# Patient Record
Sex: Male | Born: 1960 | Race: White | Hispanic: No | Marital: Married | Smoking: Never smoker
Health system: Southern US, Community
[De-identification: ages and names within clinical notes are randomized; demographics above are authoritative.]

## PROBLEM LIST (undated history)

## (undated) DIAGNOSIS — R079 Chest pain, unspecified: Secondary | ICD-10-CM

## (undated) DIAGNOSIS — K220 Achalasia of cardia: Secondary | ICD-10-CM

## (undated) DIAGNOSIS — I82409 Acute embolism and thrombosis of unspecified deep veins of unspecified lower extremity: Secondary | ICD-10-CM

## (undated) DIAGNOSIS — R9431 Abnormal electrocardiogram [ECG] [EKG]: Secondary | ICD-10-CM

## (undated) DIAGNOSIS — M79609 Pain in unspecified limb: Secondary | ICD-10-CM

## (undated) DIAGNOSIS — T7840XA Allergy, unspecified, initial encounter: Secondary | ICD-10-CM

## (undated) HISTORY — DX: Allergy, unspecified, initial encounter: T78.40XA

## (undated) HISTORY — DX: Achalasia of cardia: K22.0

## (undated) HISTORY — DX: Abnormal electrocardiogram (ECG) (EKG): R94.31

## (undated) HISTORY — DX: Chest pain, unspecified: R07.9

## (undated) HISTORY — PX: CHOLECYSTECTOMY: SHX55

## (undated) HISTORY — DX: Pain in unspecified limb: M79.609

## (undated) HISTORY — DX: Acute embolism and thrombosis of unspecified deep veins of unspecified lower extremity: I82.409

## (undated) HISTORY — PX: OTHER SURGICAL HISTORY: SHX169

---

## 2000-10-26 ENCOUNTER — Encounter: Payer: Self-pay | Admitting: Internal Medicine

## 2000-10-26 ENCOUNTER — Encounter: Admission: RE | Admit: 2000-10-26 | Discharge: 2000-10-26 | Payer: Self-pay | Admitting: Internal Medicine

## 2000-11-07 ENCOUNTER — Ambulatory Visit (HOSPITAL_COMMUNITY): Admission: RE | Admit: 2000-11-07 | Discharge: 2000-11-07 | Payer: Self-pay | Admitting: *Deleted

## 2000-11-07 ENCOUNTER — Encounter: Payer: Self-pay | Admitting: *Deleted

## 2000-11-13 HISTORY — PX: HELLER MYOTOMY: SHX5259

## 2000-11-13 HISTORY — PX: GASTRIC FUNDOPLICATION: SHX226

## 2000-11-13 HISTORY — PX: ESOPHAGOGASTRIC FUNDOPLICATION: SHX405

## 2006-04-11 ENCOUNTER — Ambulatory Visit (HOSPITAL_COMMUNITY): Admission: RE | Admit: 2006-04-11 | Discharge: 2006-04-11 | Payer: Self-pay | Admitting: Family Medicine

## 2007-06-26 IMAGING — US US ABDOMEN COMPLETE
1 series · 13 of 25 positions shown · non-contrast
Comparison: None

CLINICAL DATA: Epigastric abdominal pain. Elevated lipase.

COMPLETE ABDOMINAL ULTRASOUND  04/11/2006:

[Series 1: unknown · 0.37mm/px · 13 of 53 slices shown]
[im 1/53]
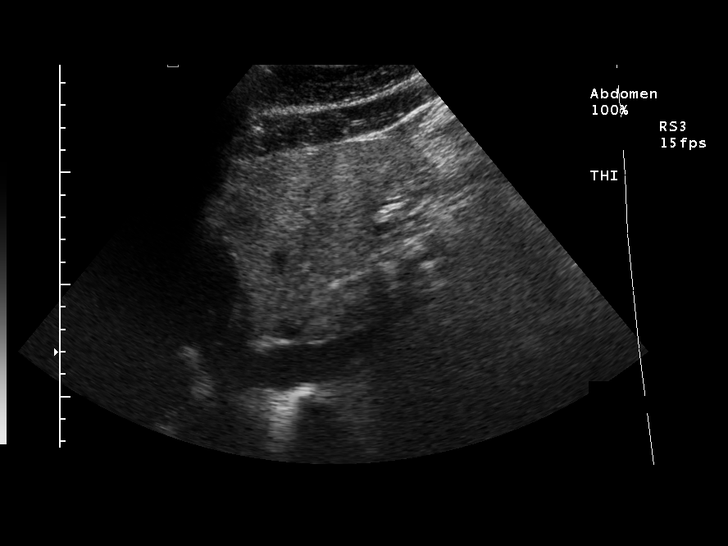
[im 5/53]
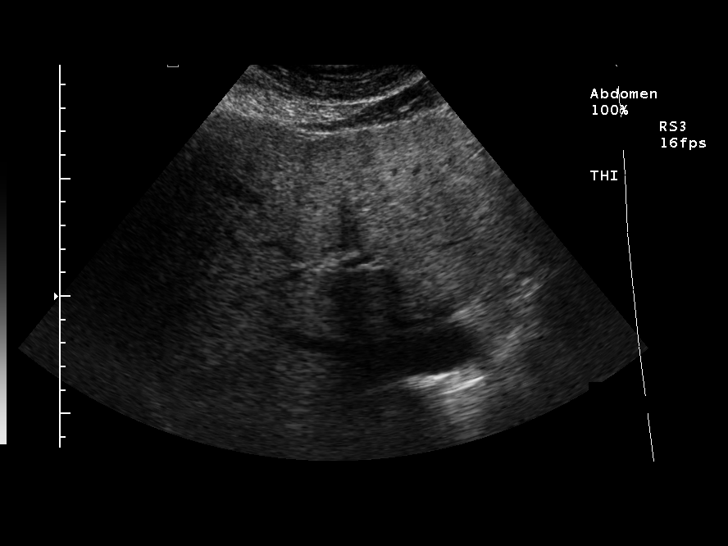
[im 9/53]
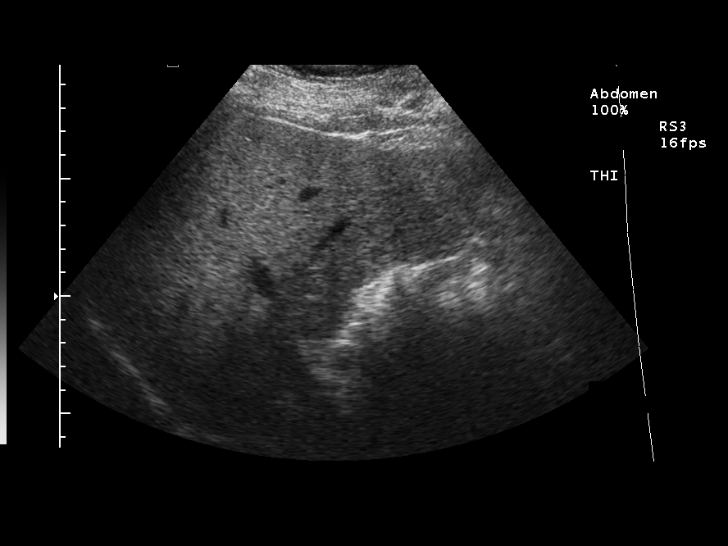
[im 14/53]
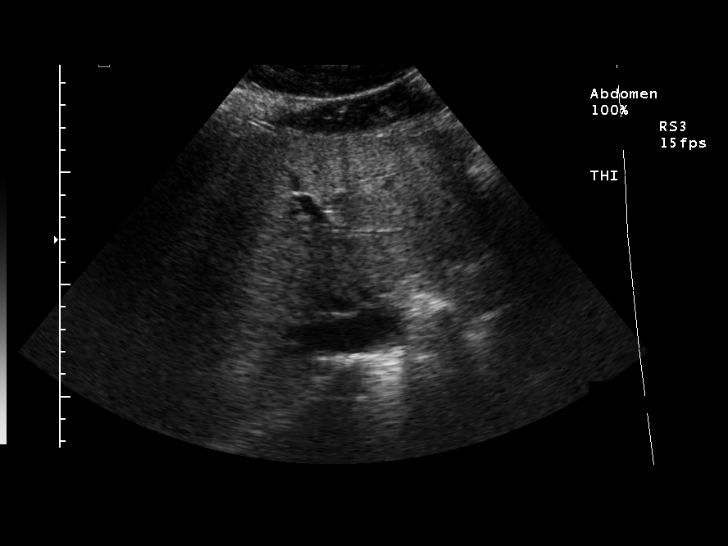
[im 18/53]
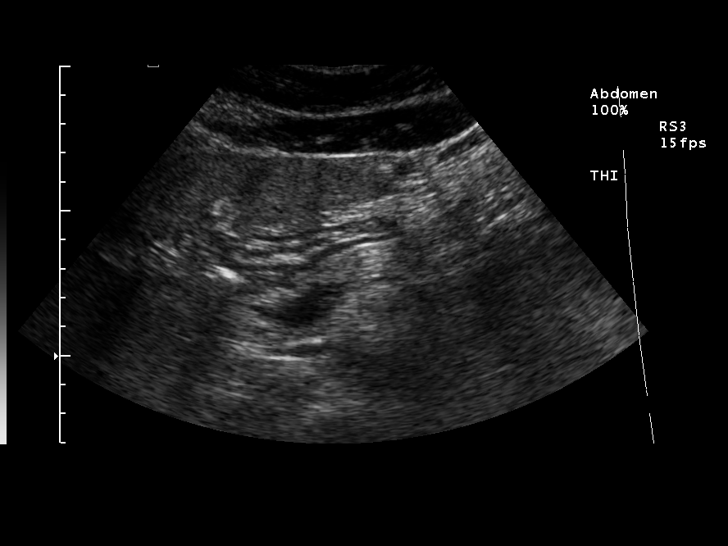
[im 22/53]
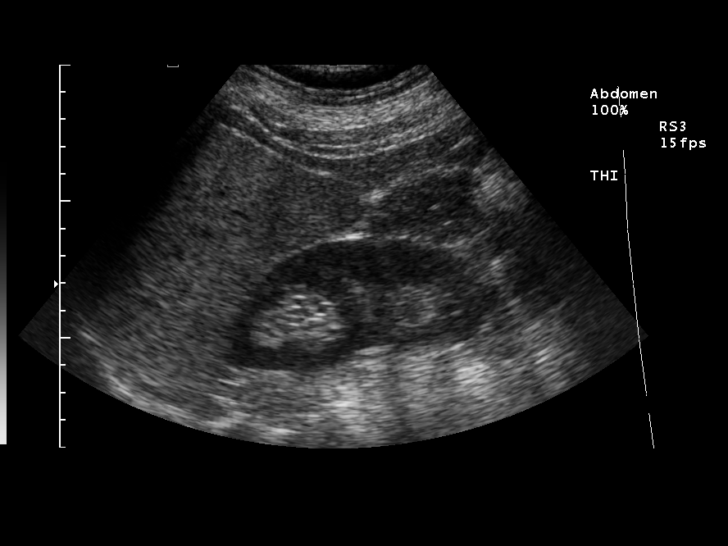
[im 27/53]
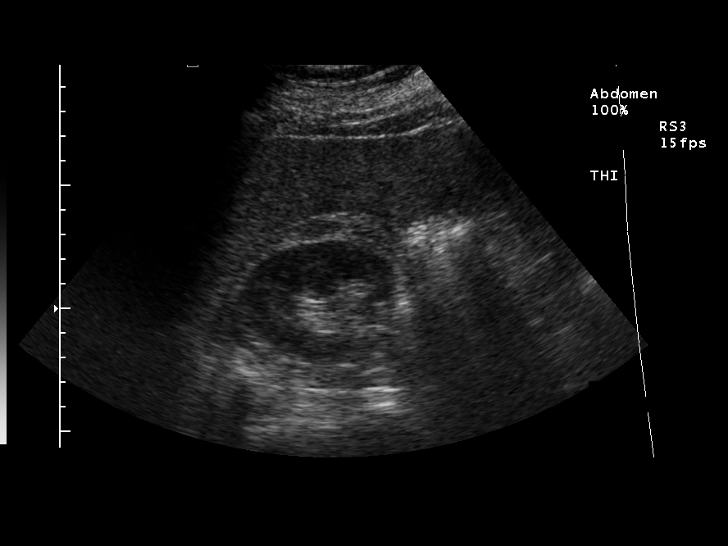
[im 31/53]
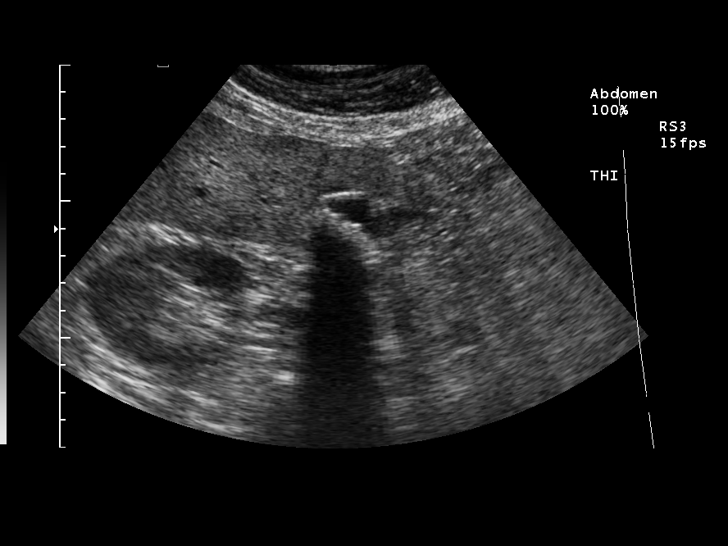
[im 35/53]
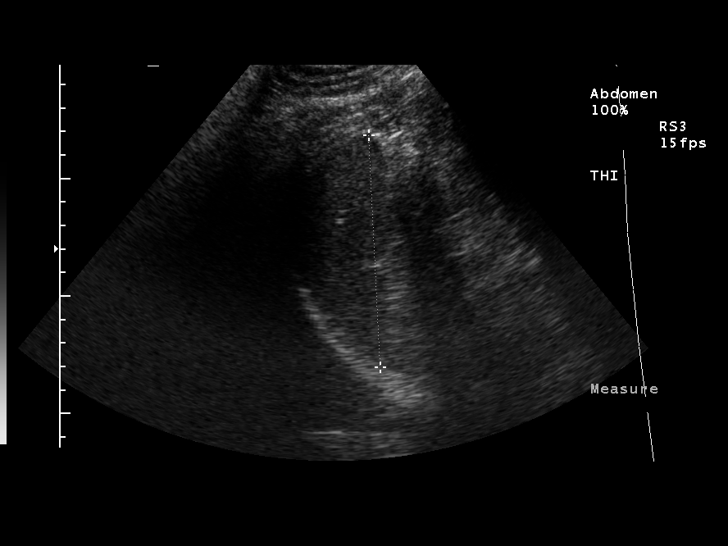
[im 40/53]
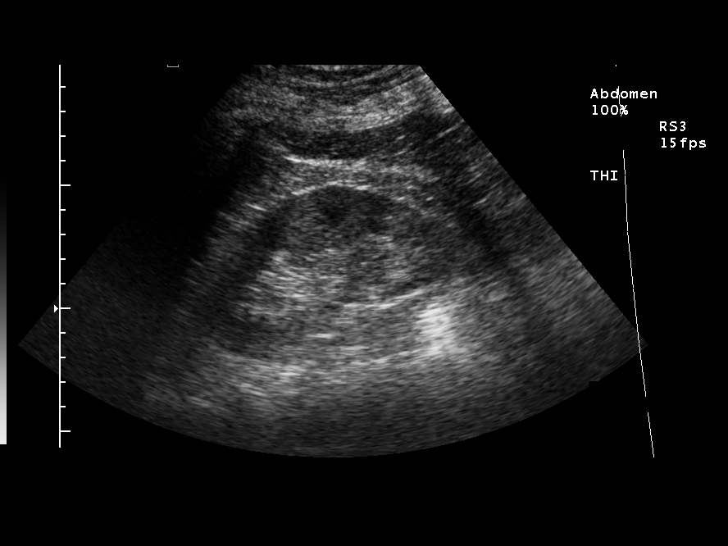
[im 44/53]
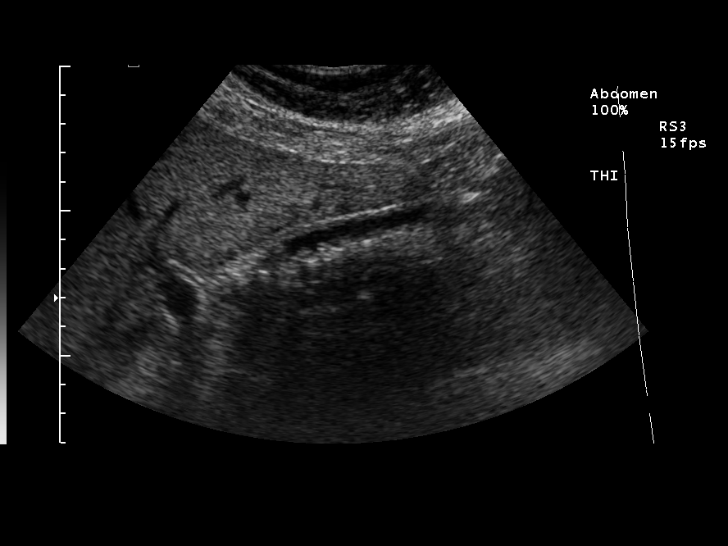
[im 48/53]
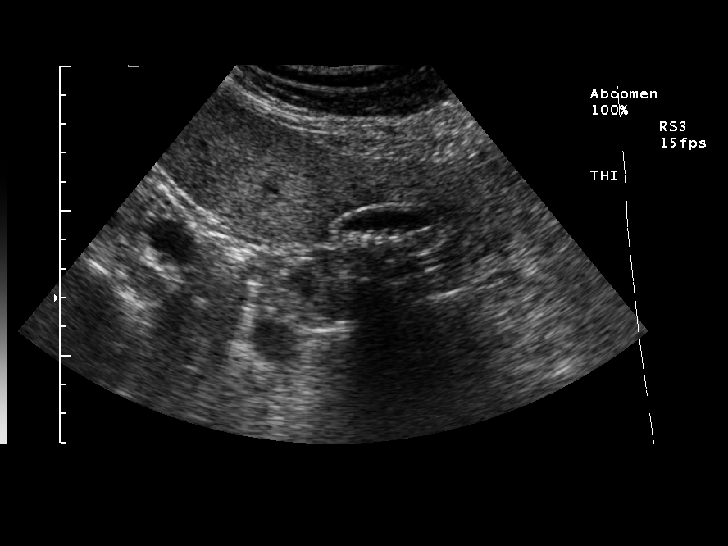
[im 53/53]
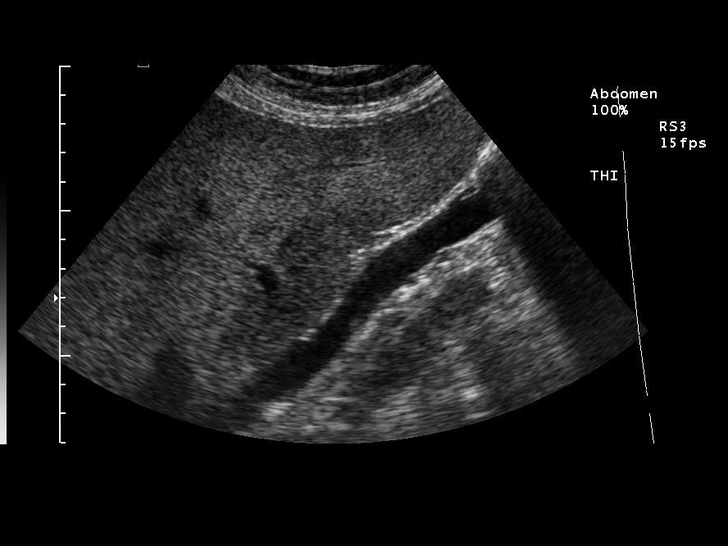

[13 of 25 positions shown; findings below may reference images not displayed]

FINDINGS: Gallbladder:  Multiple gallstones. Mild gallbladder wall thickening up to
approximately 4 mm. No pericholecystic fluid. Negative sonographic Murphy's
sign.

Common bile duct: Upper normal size to perhaps minimally dilated at 7 mm. No
shadowing common duct stones, but echogenic material is present within the duct
consistent with sludge.

Liver:  Diffusely increased and coarsened echotexture consistent with fatty
infiltration. No focal hepatic abnormalities. Patent portal vein with
hepatopetal flow.

Inferior vena cava:  Patent.

Pancreas:  Normal head and body; tail obscured by overlying bowel gas.

Spleen:  Normal, 9.9 cm in length.

Right kidney:  Normal, 11.0 cm in length. Prominent column of Bertin noted,
possibly representing duplication of the collecting system.

Left kidney:  Normal, 11.0 cm in length.

Abdominal aorta:  Normal, maximum diameter less than 2 cm.
IMPRESSION: 1. Cholelithiasis and mild gallbladder wall thickening which would be consistent
with cholecystitis.
2. Borderline enlarged common bile duct measuring 7 mm diameter. While no common
duct stones were identified, echogenic material was identified in the duct
consistent with sludge.
3. Mild diffuse fatty infiltration of the liver.
4. Pancreatic tail obscured by overlying bowel gas and therefore not evaluated.
Normal appearing pancreatic head and body.

## 2012-05-13 DIAGNOSIS — I82409 Acute embolism and thrombosis of unspecified deep veins of unspecified lower extremity: Secondary | ICD-10-CM

## 2012-05-13 HISTORY — DX: Acute embolism and thrombosis of unspecified deep veins of unspecified lower extremity: I82.409

## 2012-05-14 ENCOUNTER — Ambulatory Visit (INDEPENDENT_AMBULATORY_CARE_PROVIDER_SITE_OTHER): Payer: BC Managed Care – PPO | Admitting: Internal Medicine

## 2012-05-14 VITALS — BP 118/84 | HR 77 | Temp 98.5°F | Resp 16 | Ht 67.0 in | Wt 213.6 lb

## 2012-05-14 DIAGNOSIS — R197 Diarrhea, unspecified: Secondary | ICD-10-CM

## 2012-05-14 DIAGNOSIS — A088 Other specified intestinal infections: Secondary | ICD-10-CM

## 2012-05-14 DIAGNOSIS — Z129 Encounter for screening for malignant neoplasm, site unspecified: Secondary | ICD-10-CM

## 2012-05-14 LAB — POCT CBC
Granulocyte percent: 69.3 %G (ref 37–80)
HCT, POC: 53.1 % (ref 43.5–53.7)
Hemoglobin: 17 g/dL (ref 14.1–18.1)
Lymph, poc: 1.4 (ref 0.6–3.4)
MCH, POC: 30.4 pg (ref 27–31.2)
MCHC: 32 g/dL (ref 31.8–35.4)
MCV: 94.9 fL (ref 80–97)
MID (cbc): 0.7 (ref 0–0.9)
MPV: 9.4 fL (ref 0–99.8)
POC Granulocyte: 4.8 (ref 2–6.9)
POC LYMPH PERCENT: 20.1 %L (ref 10–50)
POC MID %: 10.6 %M (ref 0–12)
Platelet Count, POC: 232 10*3/uL (ref 142–424)
RBC: 5.6 M/uL (ref 4.69–6.13)
RDW, POC: 14.3 %
WBC: 6.9 10*3/uL (ref 4.6–10.2)

## 2012-05-14 MED ORDER — CIPROFLOXACIN HCL 500 MG PO TABS: 500.0000 mg | ORAL_TABLET | Freq: Two times a day (BID) | ORAL | Status: DC

## 2012-05-14 MED ORDER — ONDANSETRON HCL 4 MG PO TABS: ORAL_TABLET | ORAL | Status: DC

## 2012-05-14 NOTE — Progress Notes (Signed)
  Subjective:    Patient ID: [name] [name], male    DOB: Feb 28, 1961, 51 y.o.   MRN: [id number]  HPInausea, distension,anorexia,no BMs for 3 days Then watery stool after MOM Nausea comes and goes/No vomited/limited intake Several small liquid BMs last 24hr Marked fatigue No fever chills or night sweats  Review of SystemsNegative except for weight gain over the last few years Has avoided medical followups for 5 years     Objective:   Physical ExamVital signs stable Except elevated BMI Heart and lungs within normal limits Abdomen slightly distended with increased bowel sounds No organomegaly or masses Tender in the left lower quadrant to palpation but not percussion   Results for orders placed in visit on 05/14/12  POCT CBC      Component Value Range   WBC 6.9  4.6 - 10.2 K/uL   Lymph, poc 1.4  0.6 - 3.4   POC LYMPH PERCENT 20.1  10 - 50 %L   MID (cbc) 0.7  0 - 0.9   POC MID % 10.6  0 - 12 %M   POC Granulocyte 4.8  2 - 6.9   Granulocyte percent 69.3  37 - 80 %G   RBC 5.60  4.69 - 6.13 M/uL   Hemoglobin 17.0  14.1 - 18.1 g/dL   HCT, POC 40.9  81.1 - 53.7 %   MCV 94.9  80 - 97 fL   MCH, POC 30.4  27 - 31.2 pg   MCHC 32.0  31.8 - 35.4 g/dL   RDW, POC 91.4     Platelet Count, POC 232  142 - 424 K/uL   MPV 9.4  0 - 99.8 fL    Assessment & Plan:  Problem #1 gastroenteritis Culture Start Cipro 500 twice a day #6 Zofran 4-8 mg as needed Followup 48-72 hours if not controlled He will set up followup complete physical to address weight problems and probable cholesterol problems as he was discussing with Dr. Milus Glazier in 2008

## 2012-05-15 ENCOUNTER — Encounter: Payer: Self-pay | Admitting: Internal Medicine

## 2012-05-18 LAB — STOOL CULTURE

## 2012-05-20 ENCOUNTER — Ambulatory Visit (INDEPENDENT_AMBULATORY_CARE_PROVIDER_SITE_OTHER): Payer: BC Managed Care – PPO | Admitting: Family Medicine

## 2012-05-20 VITALS — BP 128/74 | HR 93 | Temp 98.3°F | Resp 16 | Ht 67.5 in | Wt 217.2 lb

## 2012-05-20 DIAGNOSIS — Z5181 Encounter for therapeutic drug level monitoring: Secondary | ICD-10-CM

## 2012-05-20 DIAGNOSIS — M549 Dorsalgia, unspecified: Secondary | ICD-10-CM

## 2012-05-20 DIAGNOSIS — M79605 Pain in left leg: Secondary | ICD-10-CM

## 2012-05-20 DIAGNOSIS — I82409 Acute embolism and thrombosis of unspecified deep veins of unspecified lower extremity: Secondary | ICD-10-CM

## 2012-05-20 DIAGNOSIS — M79609 Pain in unspecified limb: Secondary | ICD-10-CM

## 2012-05-20 LAB — POCT CBC
Hemoglobin: 14 g/dL — AB (ref 14.1–18.1)
MCH, POC: 30 pg (ref 27–31.2)
MCV: 94.8 fL (ref 80–97)
RBC: 4.67 M/uL — AB (ref 4.69–6.13)
WBC: 10.9 10*3/uL — AB (ref 4.6–10.2)

## 2012-05-20 LAB — BASIC METABOLIC PANEL
BUN: 8 mg/dL (ref 6–23)
CO2: 28 mEq/L (ref 19–32)
Chloride: 106 mEq/L (ref 96–112)
Creat: 0.98 mg/dL (ref 0.50–1.35)

## 2012-05-20 LAB — PROTIME-INR
INR: 0.96 (ref <1.50)
Prothrombin Time: 13.2 seconds (ref 11.6–15.2)

## 2012-05-20 MED ORDER — WARFARIN SODIUM 5 MG PO TABS: 5.0000 mg | ORAL_TABLET | Freq: Every day | ORAL | Status: DC

## 2012-05-20 MED ORDER — ENOXAPARIN SODIUM 150 MG/ML ~~LOC~~ SOLN: 1.5000 mg/kg | Freq: Every day | SUBCUTANEOUS | Status: DC

## 2012-05-20 MED ORDER — RIVAROXABAN 15 MG PO TABS: ORAL_TABLET | ORAL | Status: DC

## 2012-05-20 NOTE — Progress Notes (Signed)
Subjective:    Patient ID: [name] [name], male    DOB: 12-Feb-1961, 51 y.o.   MRN: [id number]  HPI  Pt presents with 6 days h/o L calf pain.  Woke up 6 days ago and thought he had a leg cramp but it has worsened.  Pain with walking.  Calf appears swollen and hot to patient.  He has never had anything like this before.  He was in a 4 hr car drive 3 days before the pain started.  No h/o clotting problems, no family h/o clotting disorders.  No trauma to leg.No knee pain or h/o injury to his knee.   Has been using tylenol over the last several days and it has only helped slightly.  He was on abx last week for presumed diverticulitis, he has finished those.  He has no SOB or CP.    Review of Systems  Respiratory: Negative for shortness of breath.   Cardiovascular: Positive for leg swelling. Negative for chest pain.  Musculoskeletal: Negative for joint swelling.       Objective:   Physical Exam  Constitutional: He is oriented to person, place, and time. He appears well-developed and well-nourished.  HENT:  Head: Normocephalic.  Right Ear: External ear normal.  Left Ear: External ear normal.  Eyes: Pupils are equal, round, and reactive to light.  Neck: Normal range of motion.  Cardiovascular: Normal rate, regular rhythm and normal heart sounds.   Pulmonary/Chest: Effort normal and breath sounds normal.  Musculoskeletal: He exhibits edema and tenderness.       Left lower leg: He exhibits tenderness (calf TTP) and swelling (Mild - L calf at 9cm measured 42.5 cm and R calf 41.5). He exhibits no edema.       Legs: Neurological: He is alert and oriented to person, place, and time.  Skin: Skin is warm and dry.  Psychiatric: He has a normal mood and affect. His behavior is normal. Thought content normal.       Assessment & Plan:   1. Leg pain, left   Pt to take OTC NSAIDs and LE doppler was ordered.  He will go to [facility] at 1pm today for doppler.  Meds will be ordered after results have  returned by call report.  Addnd per J Copland MD:  Noted to have a DVT in left distal veins on Doppler-  The clot extends to very near the popliteal fossa.  He came back in to discuss.  He describes a "cramp" in his left leg for the last 5 days or so, but he was initially distracted by GI illness.  He got better from a GI standpoint, but then the pain and swelling in his left calf continued to get worse.     He has never had a PE or DVT in the past, there is no family history of DVT/ PE.  Not a smoker.  No history of cancer, no SOB, hemoptysis, etc.    Discussed R/B/A of anticoagulation.  He is aware that there is less data concerning risk of PE from a distal DVT- however the general course of action is to treat with anticoagulation for 3 months.  Decided to start Xarelto for ease of use after discussion of alternatives (lovenox and coumadin).  Will check a stat creatinine BMP prior to start of medication.   Normal calculated creatinine clearance- he will start Xarelto as per manufactures recommendations.  He will call in about a week with an update- Sooner if worse or if  any new symptoms arise.  Estimated creatinine clearance per Cockcroft- Gault formula is 83- xarelto safe as long as greater than 30.    Results for orders placed in visit on 05/20/12  PROTIME-INR      Component Value Range   Prothrombin Time 13.2  11.6 - 15.2 seconds   INR 0.96  <1.50  POCT CBC      Component Value Range   WBC 10.9 (*) 4.6 - 10.2 K/uL   Lymph, poc 3.0  0.6 - 3.4   POC LYMPH PERCENT 27.8  10 - 50 %L   MID (cbc) 0.9  0 - 0.9   POC MID % 8.7  0 - 12 %M   POC Granulocyte 6.9  2 - 6.9   Granulocyte percent 63.5  37 - 80 %G   RBC 4.67 (*) 4.69 - 6.13 M/uL   Hemoglobin 14.0 (*) 14.1 - 18.1 g/dL   HCT, POC 40.9  81.1 - 53.7 %   MCV 94.8  80 - 97 fL   MCH, POC 30.0  27 - 31.2 pg   MCHC 31.6 (*) 31.8 - 35.4 g/dL   RDW, POC 91.4     Platelet Count, POC 317  142 - 424 K/uL   MPV 9.0  0 - 99.8 fL  BASIC  METABOLIC PANEL      Component Value Range   Sodium 142  135 - 145 mEq/L   Potassium 4.6  3.5 - 5.3 mEq/L   Chloride 106  96 - 112 mEq/L   CO2 28  19 - 32 mEq/L   Glucose, Bld 92  70 - 99 mg/dL   BUN 8  6 - 23 mg/dL   Creat 7.82  9.56 - 2.13 mg/dL   Calcium 8.9  8.4 - 08.6 mg/dL

## 2012-05-21 ENCOUNTER — Ambulatory Visit (HOSPITAL_COMMUNITY): Payer: Self-pay

## 2012-06-06 ENCOUNTER — Telehealth: Payer: Self-pay

## 2012-06-06 DIAGNOSIS — O2230 Deep phlebothrombosis in pregnancy, unspecified trimester: Secondary | ICD-10-CM

## 2012-06-06 DIAGNOSIS — I82409 Acute embolism and thrombosis of unspecified deep veins of unspecified lower extremity: Secondary | ICD-10-CM

## 2012-06-06 NOTE — Telephone Encounter (Signed)
Dr. Patsy Lager had asked Sasha to call her back with an update so he is calling to check in with her. He was here for blood clots earlier in the month. He also wanted to know if he will need his prescription refilled. He uses [facility], [facility] on [address].  Best [phone]

## 2012-06-07 NOTE — Telephone Encounter (Signed)
Pt is rechecking with Dr. Patsy Lager please call  [phone]

## 2012-06-09 NOTE — Telephone Encounter (Signed)
LMOM TO CB 

## 2012-06-09 NOTE — Telephone Encounter (Signed)
PT STATES THAT HE ABLE TO WALK NOW.  HE HAS A TINY BIT OF SWELLING.  HE TRIED WALKING FOR 15 MINUTES BUT AFTERWARDS HE FELT PAIN.  HE WAS WONDERING ABOUT THE REFILL OF MEDS AND WHEN WE CALL HIM BACK HE WILL LET us KNOW WHAT PHARMACY TO CALL.

## 2012-06-10 ENCOUNTER — Telehealth: Payer: Self-pay

## 2012-06-10 MED ORDER — RIVAROXABAN 20 MG PO TABS: 20.0000 mg | ORAL_TABLET | Freq: Every day | ORAL | Status: DC

## 2012-06-10 NOTE — Telephone Encounter (Signed)
Pt calling to talk with dr copland about his medication he is will be out of it tomorrow [phone]

## 2012-06-10 NOTE — Telephone Encounter (Signed)
He stated he has already spoken to Dr Patsy Lager and asked me to call his wife about her previous message, I have done this for him.

## 2012-06-10 NOTE — Telephone Encounter (Signed)
Called to discuss.  His leg is feeling better.  He is now finished with his 3 weeks of BID xarelto 15mg .  Will start on QD 20mg  now to complete a total of 3 months of therapy.  I will arrange an ultrasound for 6 weeks from now to evaluate his clot.

## 2012-06-10 NOTE — Telephone Encounter (Signed)
i

## 2012-06-14 ENCOUNTER — Telehealth: Payer: Self-pay

## 2012-06-14 NOTE — Telephone Encounter (Signed)
Pt would like a copy of his lab results that were recently done early part of July, please mail to his addresss.

## 2012-06-14 NOTE — Telephone Encounter (Signed)
mailed

## 2012-06-19 ENCOUNTER — Encounter: Payer: Self-pay | Admitting: Internal Medicine

## 2012-09-16 ENCOUNTER — Telehealth: Payer: Self-pay

## 2012-09-16 DIAGNOSIS — I82409 Acute embolism and thrombosis of unspecified deep veins of unspecified lower extremity: Secondary | ICD-10-CM

## 2012-09-16 NOTE — Telephone Encounter (Signed)
Called and talked with him- he finished the xarelto just about 3 weeks ago.  He has felt well and actually forgot all about it.  We have scheduled a doppler for him which should be done in the next couple of days.  He should be ok to get a colonoscopy but cautioned him to let his GI doctor know about when he finished the blood thinner

## 2012-09-16 NOTE — Telephone Encounter (Signed)
PT HAD BLOOD CLOTS IN HIS LEGS AND DR COPLAND WANTED TO MAKE SURE THEY WERE GONE BEFORE SHE SCHEDULED HIM TO HAVE A COLONOSPY DONE. ALSO HE WAS TO HAVE ANOTHER REFERRAL DONE AND WASN'T WHAT IT WAS. PLEASE CALL [phone]

## 2012-09-16 NOTE — Telephone Encounter (Signed)
I spoke to patient, he took Xarelto for his DVT., he wants to know if he needs follow up doppler for this, according to your note he was to have this done after his medications were completed. I have put in order to have this done. Also he is asking if it is okay for him to proceed now with the Colonoscopy, he had to cancel in July due to DVT, and treatment of this.

## 2012-09-22 NOTE — Telephone Encounter (Signed)
Called and LMOM-has his [place] been scheduled?  If not please let me know.  Also, wanted to talk with him about hypercoagulability testing, continued anticoagulation, etc.  Please give me a call when possible

## 2012-09-23 ENCOUNTER — Encounter: Payer: Self-pay | Admitting: Internal Medicine

## 2012-09-23 ENCOUNTER — Telehealth: Payer: Self-pay | Admitting: *Deleted

## 2012-09-23 NOTE — Telephone Encounter (Signed)
As per Dr. Cyndie Chime request called patient asked him about a follow up ultrasound for a DVT  Patient said it was scheduled for 09/25/12 at 1:00 pm.  Told patient he could also go for a follow-up colonoscopy.  Found out previous one done at [facility] gave patient office phone number.  Patient will schedule own appointment.  Angie Kiante Petrovich, CMA.

## 2012-09-25 ENCOUNTER — Telehealth: Payer: Self-pay | Admitting: Family Medicine

## 2012-09-25 ENCOUNTER — Ambulatory Visit (HOSPITAL_COMMUNITY)
Admission: RE | Admit: 2012-09-25 | Discharge: 2012-09-25 | Disposition: A | Discharge status: Home / Self Care | Payer: BC Managed Care – PPO | Source: Ambulatory Visit | Attending: Family Medicine | Admitting: Family Medicine

## 2012-09-25 DIAGNOSIS — I82409 Acute embolism and thrombosis of unspecified deep veins of unspecified lower extremity: Secondary | ICD-10-CM

## 2012-09-25 DIAGNOSIS — Z86718 Personal history of other venous thrombosis and embolism: Secondary | ICD-10-CM | POA: Insufficient documentation

## 2012-09-25 NOTE — Progress Notes (Signed)
Left:  No evidence of DVT, superficial thrombosis, or Baker's cyst.  Right:  Negative for DVT in the common femoral vein.  

## 2012-09-25 NOTE — Telephone Encounter (Signed)
Doppler received from [facility] and in your box.

## 2012-09-25 NOTE — Telephone Encounter (Signed)
He had his repeat ultrasound done today- it looked fine and clot is resolved.  Called to talk with him- he had a single episode of a distal DVT which we treated with xarelto successfully.    Discussed hypercoagulability testing. His paternal grandfather did have a DVT in the 1940s he thinks, but he is not sure of details.  No other family history of clots.  He declined to have hypercoagulability testing done at this time.  This is reasonable as it would not likely change our current management. Encouraged him to take a baby asa daily, and to move around frequently on long car or plane trips.   He is going to have pre- op labs for a colonoscopy done on 10/15/12, asked him to have a copy sent to me so I can recheck his CBC.   He will look out of any signs of recurrent DVT

## 2012-09-25 NOTE — Telephone Encounter (Signed)
Can we please get a copy of his LE doppler done in July of this year- I think at [facility]?  Thanks!

## 2012-09-25 NOTE — Telephone Encounter (Signed)
Called [facility] and they will send doppler report.

## 2012-10-14 ENCOUNTER — Ambulatory Visit (AMBULATORY_SURGERY_CENTER): Payer: BC Managed Care – PPO | Admitting: *Deleted

## 2012-10-14 VITALS — Ht 68.0 in | Wt 216.0 lb

## 2012-10-14 DIAGNOSIS — Z1211 Encounter for screening for malignant neoplasm of colon: Secondary | ICD-10-CM

## 2012-10-14 MED ORDER — NA SULFATE-K SULFATE-MG SULF 17.5-3.13-1.6 GM/177ML PO SOLN: ORAL | Status: DC

## 2012-10-25 ENCOUNTER — Ambulatory Visit (AMBULATORY_SURGERY_CENTER): Payer: BC Managed Care – PPO | Admitting: Internal Medicine

## 2012-10-25 ENCOUNTER — Encounter: Payer: Self-pay | Admitting: Internal Medicine

## 2012-10-25 VITALS — BP 117/66 | HR 68 | Temp 98.4°F | Resp 16 | Ht 68.0 in | Wt 216.0 lb

## 2012-10-25 DIAGNOSIS — Z1211 Encounter for screening for malignant neoplasm of colon: Secondary | ICD-10-CM

## 2012-10-25 MED ORDER — SODIUM CHLORIDE 0.9 % IV SOLN: 500.0000 mL | INTRAVENOUS | Status: DC

## 2012-10-25 NOTE — Patient Instructions (Addendum)
The colonoscopy showed diverticulosis but no polyps or cancer.  Next routine colonoscopy in 10 years - 2023.  Thank you for choosing me and [facility].  Iva Boop, MD, FACG    YOU HAD AN ENDOSCOPIC PROCEDURE TODAY AT THE [facility]: Refer to the procedure report that was given to you for any specific questions about what was found during the examination.  If the procedure report does not answer your questions, please call your gastroenterologist to clarify.  If you requested that your care partner not be given the details of your procedure findings, then the procedure report has been included in a sealed envelope for you to review at your convenience later.  YOU SHOULD EXPECT: Some feelings of bloating in the abdomen. Passage of more gas than usual.  Walking can help get rid of the air that was put into your GI tract during the procedure and reduce the bloating. If you had a lower endoscopy (such as a colonoscopy or flexible sigmoidoscopy) you may notice spotting of blood in your stool or on the toilet paper. If you underwent a bowel prep for your procedure, then you may not have a normal bowel movement for a few days.  DIET: Your first meal following the procedure should be a light meal and then it is ok to progress to your normal diet.  A half-sandwich or bowl of soup is an example of a good first meal.  Heavy or fried foods are harder to digest and may make you feel nauseous or bloated.  Likewise meals heavy in dairy and vegetables can cause extra gas to form and this can also increase the bloating.  Drink plenty of fluids but you should avoid alcoholic beverages for 24 hours.  ACTIVITY: Your care partner should take you home directly after the procedure.  You should plan to take it easy, moving slowly for the rest of the day.  You can resume normal activity the day after the procedure however you should NOT DRIVE or use heavy machinery for 24 hours (because of  the sedation medicines used during the test).    SYMPTOMS TO REPORT IMMEDIATELY: A gastroenterologist can be reached at any hour.  During normal business hours, 8:30 AM to 5:00 PM Monday through Friday, call [phone].  After hours and on weekends, please call the GI answering service at [phone] who will take a message and have the physician on call contact you.   Following lower endoscopy (colonoscopy or flexible sigmoidoscopy):  Excessive amounts of blood in the stool  Significant tenderness or worsening of abdominal pains  Swelling of the abdomen that is new, acute  Fever of 100F or higher  FOLLOW UP: If any biopsies were taken you will be contacted by phone or by letter within the next 1-3 weeks.  Call your gastroenterologist if you have not heard about the biopsies in 3 weeks.  Our staff will call the home number listed on your records the next business day following your procedure to check on you and address any questions or concerns that you may have at that time regarding the information given to you following your procedure. This is a courtesy call and so if there is no answer at the home number and we have not heard from you through the emergency physician on call, we will assume that you have returned to your regular daily activities without incident.  SIGNATURES/CONFIDENTIALITY: You and/or your care partner have signed paperwork which will be entered  into your electronic medical record.  These signatures attest to the fact that that the information above on your After Visit Summary has been reviewed and is understood.  Full responsibility of the confidentiality of this discharge information lies with you and/or your care-partner.    Diverticulosis and high fiber diet information given.    Next colonoscopy 10 years 2023.

## 2012-10-25 NOTE — Op Note (Signed)
[facility] [address] [place], [address]   COLONOSCOPY PROCEDURE REPORT  PATIENT: [name] [name]  MR#: [id number] BIRTHDATE: 04/22/1961 , 51  yrs. old GENDER: Male ENDOSCOPIST: Iva Boop, MD, [facility] REFERRED XL:[id number] Copland, M.D. PROCEDURE DATE:  10/25/2012 PROCEDURE:   Colonoscopy, diagnostic ASA CLASS:   Class I INDICATIONS:average risk screening. MEDICATIONS: Propofol (Diprivan) 140 mg IV, MAC sedation, administered by CRNA, and These medications were titrated to patient response per physician's verbal order  DESCRIPTION OF PROCEDURE:   After the risks benefits and alternatives of the procedure were thoroughly explained, informed consent was obtained.  A digital rectal exam revealed no abnormalities of the rectum and A digital rectal exam revealed the prostate was not enlarged.   The LB [id number]  endoscope was introduced through the anus and advanced to the cecum, which was identified by both the appendix and ileocecal valve. No adverse events experienced.   The quality of the prep was Suprep excellent The instrument was then slowly withdrawn as the colon was fully examined.      COLON FINDINGS: Mild diverticulosis was noted in the sigmoid colon. The colon mucosa was otherwise normal.  Retroflexed views revealed no abnormalities. The time to cecum=1 minutes 53 seconds. Withdrawal time=7 minutes 10 seconds.  The scope was withdrawn and the procedure completed. COMPLICATIONS: There were no complications.  ENDOSCOPIC IMPRESSION: 1.   Mild diverticulosis was noted in the sigmoid colon 2.   The colon mucosa was otherwise normal - excellent prep  RECOMMENDATIONS: Repeat Colonscopy in 10 years.   eSigned:  Iva Boop, MD, [facility] 10/25/2012 3:41 PM   cc: Abbe Amsterdam, MD and The Patient

## 2012-10-25 NOTE — Progress Notes (Signed)
Patient did not experience any of the following events: a burn prior to discharge; a fall within the facility; wrong site/side/patient/procedure/implant event; or a hospital transfer or hospital admission upon discharge from the facility. (G8907) Patient did not have preoperative order for IV antibiotic SSI prophylaxis. (G8918)  

## 2012-10-28 ENCOUNTER — Telehealth: Payer: Self-pay | Admitting: *Deleted

## 2012-10-28 NOTE — Telephone Encounter (Signed)
  Follow up Call-  Call back number 10/25/2012  Post procedure Call Back phone  # [phone] cell  Permission to leave phone message Yes     Patient questions:  Do you have a fever, pain , or abdominal swelling? no Pain Score  0 *  Have you tolerated food without any problems? yes  Have you been able to return to your normal activities? yes  Do you have any questions about your discharge instructions: Diet   no Medications  no Follow up visit  no  Do you have questions or concerns about your Care? no  Actions: * If pain score is 4 or above: No action needed, pain <4.

## 2013-08-22 ENCOUNTER — Telehealth: Payer: Self-pay

## 2013-08-22 ENCOUNTER — Ambulatory Visit (INDEPENDENT_AMBULATORY_CARE_PROVIDER_SITE_OTHER): Payer: BC Managed Care – PPO | Admitting: Internal Medicine

## 2013-08-22 VITALS — BP 112/68 | HR 60 | Temp 98.7°F | Resp 16 | Ht 67.0 in | Wt 219.0 lb

## 2013-08-22 DIAGNOSIS — Z5189 Encounter for other specified aftercare: Secondary | ICD-10-CM

## 2013-08-22 DIAGNOSIS — T782XXD Anaphylactic shock, unspecified, subsequent encounter: Secondary | ICD-10-CM

## 2013-08-22 DIAGNOSIS — H6092 Unspecified otitis externa, left ear: Secondary | ICD-10-CM

## 2013-08-22 DIAGNOSIS — H9202 Otalgia, left ear: Secondary | ICD-10-CM

## 2013-08-22 DIAGNOSIS — T782XXA Anaphylactic shock, unspecified, initial encounter: Secondary | ICD-10-CM | POA: Insufficient documentation

## 2013-08-22 DIAGNOSIS — H9209 Otalgia, unspecified ear: Secondary | ICD-10-CM

## 2013-08-22 DIAGNOSIS — Z91030 Bee allergy status: Secondary | ICD-10-CM

## 2013-08-22 DIAGNOSIS — H60399 Other infective otitis externa, unspecified ear: Secondary | ICD-10-CM

## 2013-08-22 DIAGNOSIS — Z91038 Other insect allergy status: Secondary | ICD-10-CM

## 2013-08-22 MED ORDER — EPINEPHRINE 0.3 MG/0.3ML IJ SOAJ: 0.3000 mg | Freq: Once | INTRAMUSCULAR | Status: DC

## 2013-08-22 MED ORDER — NEOMYCIN-POLYMYXIN-HC 3.5-10000-1 OT SOLN: 4.0000 [drp] | Freq: Four times a day (QID) | OTIC | Status: DC

## 2013-08-22 MED ORDER — CIPROFLOXACIN-HYDROCORTISONE 0.2-1 % OT SUSP: 3.0000 [drp] | Freq: Two times a day (BID) | OTIC | Status: DC

## 2013-08-22 NOTE — Telephone Encounter (Signed)
Please advise is there anything more cost effective? cipro otic was sent.

## 2013-08-22 NOTE — Telephone Encounter (Signed)
Please notify patient I've sent:  Meds ordered this encounter  Medications  . neomycin-polymyxin-hydrocortisone (CORTISPORIN) otic solution    Sig: Place 4 drops into the left ear 4 (four) times daily.    Dispense:  10 mL    Refill:  0    Order Specific Question:  Supervising Provider    Answer:  DOOLITTLE, ROBERT P [[id number]]

## 2013-08-22 NOTE — Patient Instructions (Signed)
Otitis Externa Otitis externa is a bacterial or fungal infection of the outer ear canal. This is the area from the eardrum to the outside of the ear. Otitis externa is sometimes called "swimmer's ear." CAUSES  Possible causes of infection include:  Swimming in dirty water.  Moisture remaining in the ear after swimming or bathing.  Mild injury (trauma) to the ear.  Objects stuck in the ear (foreign body).  Cuts or scrapes (abrasions) on the outside of the ear. SYMPTOMS  The first symptom of infection is often itching in the ear canal. Later signs and symptoms may include swelling and redness of the ear canal, ear pain, and yellowish-white fluid (pus) coming from the ear. The ear pain may be worse when pulling on the earlobe. DIAGNOSIS  Your caregiver will perform a physical exam. A sample of fluid may be taken from the ear and examined for bacteria or fungi. TREATMENT  Antibiotic ear drops are often given for 10 to 14 days. Treatment may also include pain medicine or corticosteroids to reduce itching and swelling. PREVENTION   Keep your ear dry. Use the corner of a towel to absorb water out of the ear canal after swimming or bathing.  Avoid scratching or putting objects inside your ear. This can damage the ear canal or remove the protective wax that lines the canal. This makes it easier for bacteria and fungi to grow.  Avoid swimming in lakes, polluted water, or poorly chlorinated pools.  You may use ear drops made of rubbing alcohol and vinegar after swimming. Combine equal parts of white vinegar and alcohol in a bottle. Put 3 or 4 drops into each ear after swimming. HOME CARE INSTRUCTIONS   Apply antibiotic ear drops to the ear canal as prescribed by your caregiver.  Only take over-the-counter or prescription medicines for pain, discomfort, or fever as directed by your caregiver.  If you have diabetes, follow any additional treatment instructions from your caregiver.  Keep all  follow-up appointments as directed by your caregiver. SEEK MEDICAL CARE IF:   You have a fever.  Your ear is still red, swollen, painful, or draining pus after 3 days.  Your redness, swelling, or pain gets worse.  You have a severe headache.  You have redness, swelling, pain, or tenderness in the area behind your ear. MAKE SURE YOU:   Understand these instructions.  Will watch your condition.  Will get help right away if you are not doing well or get worse. Document Released: 10/30/2005 Document Revised: 01/22/2012 Document Reviewed: 11/16/2011 Exit[employer] Patient Information 2014 [employer].  

## 2013-08-22 NOTE — Progress Notes (Signed)
  Subjective:    Patient ID: [name] [name], male    DOB: 03-20-1961, 52 y.o.   MRN: [id number]  HPI    Review of Systems     Objective:   Physical Exam        Assessment & Plan:

## 2013-08-22 NOTE — Telephone Encounter (Signed)
Pt is wanting to know if there is another prescription of the ear drops he can get because they are like 200$ at the pharmacy Call back number is [phone]

## 2013-08-22 NOTE — Progress Notes (Signed)
  Subjective:    Patient ID: [name] [name], male    DOB: May 13, 1961, 52 y.o.   MRN: [id number]  HPI Presents today for left ear itching. Started last week, this morning his ear felt full. Feels itching both inside and outside Now starting to feel painful and swollen today. No fever. No diabetes.   Review of Systems     Objective:   Physical Exam  Constitutional: He is oriented to person, place, and time. He appears well-developed and well-nourished. No distress.  HENT:  Head: Normocephalic.  Right Ear: Hearing, tympanic membrane, external ear and ear canal normal.  Left Ear: Hearing and tympanic membrane normal. No lacerations. There is swelling and tenderness. No drainage. No foreign bodies. No mastoid tenderness. Tympanic membrane is not injected, not scarred, not perforated, not erythematous, not retracted and not bulging. Tympanic membrane mobility is normal.  No middle ear effusion. No hemotympanum. No decreased hearing is noted.  Nose: Nose normal.  Mouth/Throat: Oropharynx is clear and moist.  Left ear canal red and mildly swollen.  Eyes: EOM are normal.  Neck: Neck supple.  Pulmonary/Chest: Effort normal.  Lymphadenopathy:    He has no cervical adenopathy.  Neurological: He is alert and oriented to person, place, and time. No cranial nerve deficit. He exhibits normal muscle tone. Coordination normal.  Psychiatric: He has a normal mood and affect.          Assessment & Plan:  External otitis left Hx of anaphylaxis Cipro otic/RF epipen

## 2013-08-22 NOTE — Telephone Encounter (Signed)
Pt went to pharmacy ear drops are like 200$ he wants to know if he can get another prescription Call back number is [phone]

## 2013-08-24 NOTE — Telephone Encounter (Signed)
Pt notified that rx is ready for pick up

## 2015-05-10 ENCOUNTER — Ambulatory Visit (INDEPENDENT_AMBULATORY_CARE_PROVIDER_SITE_OTHER): Payer: BLUE CROSS/BLUE SHIELD | Admitting: Emergency Medicine

## 2015-05-10 VITALS — BP 106/74 | HR 80 | Temp 99.1°F | Resp 16 | Ht 67.0 in | Wt 208.0 lb

## 2015-05-10 DIAGNOSIS — K5732 Diverticulitis of large intestine without perforation or abscess without bleeding: Secondary | ICD-10-CM

## 2015-05-10 MED ORDER — METRONIDAZOLE 500 MG PO TABS: 500.0000 mg | ORAL_TABLET | Freq: Four times a day (QID) | ORAL | Status: DC

## 2015-05-10 MED ORDER — CIPROFLOXACIN HCL 500 MG PO TABS: 500.0000 mg | ORAL_TABLET | Freq: Two times a day (BID) | ORAL | Status: DC

## 2015-05-10 NOTE — Progress Notes (Signed)
Subjective:  Patient ID: [name] [name], male    DOB: November 11, 1961  Age: 54 y.o. MRN: [id number]  CC: Abdominal Pain   HPI [name] [name] presents  with left lower quadrant abdominal pain over the last 2 days. Said the pain is progressively worsened. He has a history of diverticulosis on colonoscopy. No fever chills no nausea vomiting no blood in stool. No change in stool pattern. Says the pain is worse when he [place] the railroad tracks. Said no improvement with over-the-counter medication. His he is concerned by the pain as he starting a new job tomorrow in [place] and doesn't want to have this interfere with his new job.  History [name] [name] has a past medical history of DVT (deep venous thrombosis) (05/2012) and Achalasia.   He has past surgical history that includes Gastric fundoplication (2002); Heller myotomy (2002); and gallbladder .   His  family history is negative for Colon cancer, Stomach cancer, Esophageal cancer, and Rectal cancer.  He   reports that he has never smoked. He has never used smokeless tobacco. He reports that he does not drink alcohol or use illicit drugs.  Outpatient Prescriptions Prior to Visit  Medication Sig Dispense Refill  . acetaminophen (TYLENOL) 325 MG tablet Take 250 mg by mouth every 6 (six) hours as needed.    Marland Kitchen EPINEPHrine (EPIPEN 2-PAK) 0.3 mg/0.3 mL SOAJ injection Inject 0.3 mLs (0.3 mg total) into the muscle once. (Patient not taking: Reported on 05/10/2015) 2 Device 1  . neomycin-polymyxin-hydrocortisone (CORTISPORIN) otic solution Place 4 drops into the left ear 4 (four) times daily. (Patient not taking: Reported on 05/10/2015) 10 mL 0   No facility-administered medications prior to visit.    History   Social History  . Marital Status: Married    Spouse Name: N/A  . Number of Children: N/A  . Years of Education: N/A   Social History Main Topics  . Smoking status: Never Smoker   . Smokeless tobacco: Never Used  . Alcohol Use: No  .  Drug Use: No  . Sexual Activity: Not on file   Other Topics Concern  . None   Social History Narrative     Review of Systems  Constitutional: Negative for fever, chills and appetite change.  HENT: Negative for congestion, ear pain, postnasal drip, sinus pressure and sore throat.   Eyes: Negative for pain and redness.  Respiratory: Negative for cough, shortness of breath and wheezing.   Cardiovascular: Negative for leg swelling.  Gastrointestinal: Positive for abdominal pain. Negative for nausea, vomiting, diarrhea, constipation and blood in stool.  Endocrine: Negative for polyuria.  Genitourinary: Negative for dysuria, urgency, frequency and flank pain.  Musculoskeletal: Negative for gait problem.  Skin: Negative for rash.  Neurological: Negative for weakness and headaches.  Psychiatric/Behavioral: Negative for confusion and decreased concentration. The patient is not nervous/anxious.     Objective:  BP 106/74 mmHg  Pulse 80  Temp(Src) 99.1 F (37.3 C) (Oral)  Resp 16  Ht 5\' 7"  (1.702 m)  Wt 208 lb (94.348 kg)  BMI 32.57 kg/m2  SpO2 98%  Physical Exam  Constitutional: He is oriented to person, place, and time. He appears well-developed and well-nourished. No distress.  HENT:  Head: Normocephalic and atraumatic.  Right Ear: External ear normal.  Left Ear: External ear normal.  Nose: Nose normal.  Eyes: Conjunctivae and EOM are normal. Pupils are equal, round, and reactive to light. No scleral icterus.  Neck: Normal range of motion. Neck supple. No tracheal  deviation present.  Cardiovascular: Normal rate, regular rhythm and normal heart sounds.   Pulmonary/Chest: Effort normal. No respiratory distress. He has no wheezes. He has no rales.  Abdominal: He exhibits no mass. There is tenderness in the left lower quadrant. There is rebound and guarding.  Musculoskeletal: He exhibits no edema.  Lymphadenopathy:    He has no cervical adenopathy.  Neurological: He is alert and  oriented to person, place, and time. Coordination normal.  Skin: Skin is warm and dry. No rash noted.  Psychiatric: He has a normal mood and affect. His behavior is normal.      Assessment & Plan:   [name] [name] was seen today for abdominal pain.  Diagnoses and all orders for this visit:  Diverticulitis of colon  Other orders -     ciprofloxacin (CIPRO) 500 MG tablet; Take 1 tablet (500 mg total) by mouth 2 (two) times daily. -     metroNIDAZOLE (FLAGYL) 500 MG tablet; Take 1 tablet (500 mg total) by mouth 4 (four) times daily.   I am having [name] [name] start on ciprofloxacin and metroNIDAZOLE. I am also having him maintain his acetaminophen, EPINEPHrine, and neomycin-polymyxin-hydrocortisone.  Meds ordered this encounter  Medications  . ciprofloxacin (CIPRO) 500 MG tablet    Sig: Take 1 tablet (500 mg total) by mouth 2 (two) times daily.    Dispense:  20 tablet    Refill:  0  . metroNIDAZOLE (FLAGYL) 500 MG tablet    Sig: Take 1 tablet (500 mg total) by mouth 4 (four) times daily.    Dispense:  40 tablet    Refill:  0    Appropriate red flag conditions were discussed with the patient as well as actions that should be taken.  Patient expressed his understanding.  Given his colonoscopy was significant for diverticulosis I don't think a CAT scan at this point is warranted if his condition worsens i.e. fever chills nausea vomiting or increased pain then we need to see him back or have him go to the emergency room and I explained to him. He understands will comply.  Follow-up: Return if symptoms worsen or fail to improve.  Carmelina Dane, MD

## 2015-05-10 NOTE — Patient Instructions (Signed)

## 2015-05-25 ENCOUNTER — Encounter: Payer: Self-pay | Admitting: *Deleted

## 2015-05-31 ENCOUNTER — Encounter: Payer: Self-pay | Admitting: Cardiovascular Disease

## 2015-07-09 ENCOUNTER — Encounter (HOSPITAL_COMMUNITY): Payer: Self-pay | Admitting: Emergency Medicine

## 2015-07-09 ENCOUNTER — Emergency Department (INDEPENDENT_AMBULATORY_CARE_PROVIDER_SITE_OTHER)
Admission: EM | Admit: 2015-07-09 | Discharge: 2015-07-09 | Disposition: A | Discharge status: Nursing Home (Includes ICF) | Payer: BLUE CROSS/BLUE SHIELD | Source: Home / Self Care | Attending: Family Medicine | Admitting: Family Medicine

## 2015-07-09 ENCOUNTER — Observation Stay (HOSPITAL_COMMUNITY)
Admission: EM | Admit: 2015-07-09 | Discharge: 2015-07-10 | Disposition: A | Discharge status: Home / Self Care | Payer: BLUE CROSS/BLUE SHIELD | Attending: Emergency Medicine | Admitting: Emergency Medicine

## 2015-07-09 ENCOUNTER — Encounter (HOSPITAL_COMMUNITY): Payer: Self-pay | Admitting: *Deleted

## 2015-07-09 DIAGNOSIS — Z86718 Personal history of other venous thrombosis and embolism: Secondary | ICD-10-CM | POA: Diagnosis not present

## 2015-07-09 DIAGNOSIS — R531 Weakness: Secondary | ICD-10-CM | POA: Diagnosis not present

## 2015-07-09 DIAGNOSIS — R200 Anesthesia of skin: Principal | ICD-10-CM | POA: Insufficient documentation

## 2015-07-09 DIAGNOSIS — G451 Carotid artery syndrome (hemispheric): Secondary | ICD-10-CM

## 2015-07-09 DIAGNOSIS — G459 Transient cerebral ischemic attack, unspecified: Secondary | ICD-10-CM

## 2015-07-09 DIAGNOSIS — J329 Chronic sinusitis, unspecified: Secondary | ICD-10-CM | POA: Diagnosis not present

## 2015-07-09 DIAGNOSIS — R202 Paresthesia of skin: Secondary | ICD-10-CM

## 2015-07-09 LAB — CBC
HEMATOCRIT: 46 % (ref 39.0–52.0)
Hemoglobin: 15.6 g/dL (ref 13.0–17.0)
MCH: 31 pg (ref 26.0–34.0)
MCHC: 33.9 g/dL (ref 30.0–36.0)
MCV: 91.3 fL (ref 78.0–100.0)
Platelets: 224 10*3/uL (ref 150–400)
RBC: 5.04 MIL/uL (ref 4.22–5.81)
RDW: 13.7 % (ref 11.5–15.5)
WBC: 10.8 10*3/uL — AB (ref 4.0–10.5)

## 2015-07-09 LAB — I-STAT CHEM 8, ED
BUN: 11 mg/dL (ref 6–20)
CHLORIDE: 103 mmol/L (ref 101–111)
Calcium, Ion: 1.13 mmol/L (ref 1.12–1.23)
Creatinine, Ser: 1.1 mg/dL (ref 0.61–1.24)
Glucose, Bld: 86 mg/dL (ref 65–99)
HCT: 47 % (ref 39.0–52.0)
Hemoglobin: 16 g/dL (ref 13.0–17.0)
POTASSIUM: 4.6 mmol/L (ref 3.5–5.1)
SODIUM: 140 mmol/L (ref 135–145)
TCO2: 26 mmol/L (ref 0–100)

## 2015-07-09 LAB — DIFFERENTIAL
Basophils Absolute: 0.1 10*3/uL (ref 0.0–0.1)
Basophils Relative: 1 % (ref 0–1)
Eosinophils Absolute: 0.7 10*3/uL (ref 0.0–0.7)
Eosinophils Relative: 7 % — ABNORMAL HIGH (ref 0–5)
LYMPHS ABS: 2.7 10*3/uL (ref 0.7–4.0)
LYMPHS PCT: 26 % (ref 12–46)
MONO ABS: 1.1 10*3/uL — AB (ref 0.1–1.0)
MONOS PCT: 10 % (ref 3–12)
NEUTROS ABS: 6.1 10*3/uL (ref 1.7–7.7)
Neutrophils Relative %: 56 % (ref 43–77)

## 2015-07-09 LAB — I-STAT TROPONIN, ED: Troponin i, poc: 0 ng/mL (ref 0.00–0.08)

## 2015-07-09 NOTE — ED Notes (Signed)
Pt. reports brief numbness at left forearm this evening , seen at [facility] urgent care transferred here for further evaluation ,  Alert and oriented , speech clear , no facial asymmetry , equal strong grips with no arm drift . Denies numbness at arrival .

## 2015-07-09 NOTE — ED Notes (Signed)
Pt  Reports  Had  An  Episode  Around  5   30  Today  Whereas  His  l  Arm  Became  Numb  With a   Decreased  Grip         -  He  Reports it  Is  Better  Now        he  Is  At this  Time  Alert  And  Oriented  His  Skin is  Warm and  Dry  He  denys  Any  Chest pain or  Any  Shortness  Of  Breath       He is  Able  To  Smile   And  He  Has  No  Chronic medical  Problems

## 2015-07-09 NOTE — ED Provider Notes (Signed)
CSN: [id number]     Arrival date & time 07/09/15  4098 History   First MD Initiated Contact with Patient 07/09/15 1907     Chief Complaint  Patient presents with  . Extremity Weakness   (Consider location/radiation/quality/duration/timing/severity/associated sxs/prior Treatment) Patient is a 54 y.o. male presenting with extremity weakness. The history is provided by the patient and the spouse.  Extremity Weakness This is a new problem. The current episode started 1 to 2 hours ago. The problem has been resolved. Pertinent negatives include no chest pain, no abdominal pain and no headaches. Associated symptoms comments: Paresthesias and motor dysfunction to left upper ext..    Past Medical History  Diagnosis Date  . DVT (deep venous thrombosis) 05/2012    left leg  . Achalasia   . Chest pain   . Nonspecific ST-T wave electrocardiographic changes     GXT, EF 62%  . Limb pain     venous duplex, abonormal results, some edmea   Past Surgical History  Procedure Laterality Date  . Gastric fundoplication  2002    [place]  . Heller myotomy  2002  . Gallbladder      Family History  Problem Relation Age of Onset  . Colon cancer Neg Hx   . Stomach cancer Neg Hx   . Esophageal cancer Neg Hx   . Rectal cancer Neg Hx    Social History  Substance Use Topics  . Smoking status: Never Smoker   . Smokeless tobacco: Never Used  . Alcohol Use: No    Review of Systems  Cardiovascular: Negative for chest pain.  Gastrointestinal: Negative for abdominal pain.  Musculoskeletal: Positive for extremity weakness.  Neurological: Positive for weakness and numbness. Negative for dizziness, syncope, facial asymmetry, speech difficulty, light-headedness and headaches.    Allergies  Bee venom  Home Medications   Prior to Admission medications   Medication Sig Start Date End Date Taking? Authorizing Provider  acetaminophen (TYLENOL) 325 MG tablet Take 250 mg by mouth every 6 (six) hours as needed.     Historical Provider, MD  ciprofloxacin (CIPRO) 500 MG tablet Take 1 tablet (500 mg total) by mouth 2 (two) times daily. 05/10/15   Carmelina Dane, MD  EPINEPHrine (EPIPEN 2-PAK) 0.3 mg/0.3 mL SOAJ injection Inject 0.3 mLs (0.3 mg total) into the muscle once. Patient not taking: Reported on 05/10/2015 08/22/13   Jonita Albee, MD  metroNIDAZOLE (FLAGYL) 500 MG tablet Take 1 tablet (500 mg total) by mouth 4 (four) times daily. 05/10/15   Carmelina Dane, MD  neomycin-polymyxin-hydrocortisone (CORTISPORIN) otic solution Place 4 drops into the left ear 4 (four) times daily. Patient not taking: Reported on 05/10/2015 08/22/13   Porfirio Oar, PA-C   Meds Ordered and Administered this Visit  Medications - No data to display  BP 146/86 mmHg  Pulse 79  Temp(Src) 98.8 F (37.1 C) (Oral)  Resp 16  SpO2 97% No data found.   Physical Exam  Constitutional: He is oriented to person, place, and time. He appears well-developed and well-nourished. No distress.  Eyes: EOM are normal. Pupils are equal, round, and reactive to light.  Neck: Normal range of motion. Neck supple.  Cardiovascular: Regular rhythm and normal heart sounds.   Pulmonary/Chest: Effort normal and breath sounds normal.  Musculoskeletal: Normal range of motion.  Lymphadenopathy:    He has no cervical adenopathy.  Neurological: He is alert and oriented to person, place, and time. He has normal strength. He is not disoriented. No cranial  nerve deficit or sensory deficit. Coordination and gait normal. GCS eye subscore is 4. GCS verbal subscore is 5. GCS motor subscore is 6.  Skin: Skin is warm and dry.  Nursing note and vitals reviewed.   ED Course  Procedures (including critical care time)  Labs Review Labs Reviewed - No data to display  Imaging Review No results found.   Visual Acuity Review  Right Eye Distance:   Left Eye Distance:   Bilateral Distance:    Right Eye Near:   Left Eye Near:    Bilateral  Near:         MDM   1. TIA involving left internal carotid artery    Sent for eval of poss tia , left upper ext neuro sx, h/o unexplained dvt left lower leg.    Linna Hoff, MD 07/09/15 [phone]

## 2015-07-09 NOTE — ED Provider Notes (Addendum)
CSN: [id number]     Arrival date & time 07/09/15  1951 History  This chart was scribed for Tomasita Crumble, MD by Phillis Haggis, ED Scribe. This patient was seen in room A04C/A04C and patient care was started at 11:25 PM.    Chief Complaint  Patient presents with  . Numbness   The history is provided by the patient. No language interpreter was used.  HPI Comments: [name] [name] is a 54 y.o. male with hx of DVT and achalasia who presents to the Emergency Department complaining of gradually improving numbness onset 5 hours ago. Pt states that he was sitting in the movie theater when he experienced numbness in his left arm that did not feel like when his arm normally falls asleep. He states that she could not grasp with his hand and wrist and states that "it didn't feel right, I had no strength or power in the lower part of my arm." He states that the episode lasted about 15 minutes at its worse, but continued to feel weak and unresponsive after; the entire episode lasted about 45 minutes. Pt was seen at Urgent Care and transferred for further evaluation. Pt denies hx of similar symptoms, use of daily medications, dizziness, weakness, nausea or vomiting. Wife denies facial asymmetry, abnormal gait, or slurred speech. Pt states that he currently does not feel any numbness.   Past Medical History  Diagnosis Date  . DVT (deep venous thrombosis) 05/2012    left leg  . Achalasia   . Chest pain   . Nonspecific ST-T wave electrocardiographic changes     GXT, EF 62%  . Limb pain     venous duplex, abonormal results, some edmea   Past Surgical History  Procedure Laterality Date  . Gastric fundoplication  2002    [place]et  . Heller myotomy  2002  . Gallbladder      Family History  Problem Relation Age of Onset  . Colon cancer Neg Hx   . Stomach cancer Neg Hx   . Esophageal cancer Neg Hx   . Rectal cancer Neg Hx    Social History  Substance Use Topics  . Smoking status: Never Smoker   . Smokeless  tobacco: Never Used  . Alcohol Use: No    Review of Systems 10 Systems reviewed and all are negative for acute change except as noted in the HPI. Allergies  Bee venom  Home Medications   Prior to Admission medications   Medication Sig Start Date End Date Taking? Authorizing Provider  ciprofloxacin (CIPRO) 500 MG tablet Take 1 tablet (500 mg total) by mouth 2 (two) times daily. 05/10/15   Carmelina Dane, MD  EPINEPHrine (EPIPEN 2-PAK) 0.3 mg/0.3 mL SOAJ injection Inject 0.3 mLs (0.3 mg total) into the muscle once. Patient not taking: Reported on 05/10/2015 08/22/13   Jonita Albee, MD  metroNIDAZOLE (FLAGYL) 500 MG tablet Take 1 tablet (500 mg total) by mouth 4 (four) times daily. 05/10/15   Carmelina Dane, MD  neomycin-polymyxin-hydrocortisone (CORTISPORIN) otic solution Place 4 drops into the left ear 4 (four) times daily. Patient not taking: Reported on 05/10/2015 08/22/13   Chelle Jeffery, PA-C   BP 127/88 mmHg  Pulse 61  Temp(Src) 98.6 F (37 C) (Oral)  Resp 16  SpO2 98%  Physical Exam  Constitutional: He is oriented to person, place, and time. Vital signs are normal. He appears well-developed and well-nourished.  Non-toxic appearance. He does not appear ill. No distress.  HENT:  Head:  Normocephalic and atraumatic.  Nose: Nose normal.  Mouth/Throat: Oropharynx is clear and moist. No oropharyngeal exudate.  Eyes: Conjunctivae and EOM are normal. Pupils are equal, round, and reactive to light. No scleral icterus.  Neck: Normal range of motion. Neck supple. No tracheal deviation, no edema, no erythema and normal range of motion present. No thyroid mass and no thyromegaly present.  Cardiovascular: Normal rate, regular rhythm, S1 normal, S2 normal, normal heart sounds, intact distal pulses and normal pulses.  Exam reveals no gallop and no friction rub.   No murmur heard. Pulses:      Radial pulses are 2+ on the right side, and 2+ on the left side.       Dorsalis pedis pulses  are 2+ on the right side, and 2+ on the left side.  Pulmonary/Chest: Effort normal and breath sounds normal. No respiratory distress. He has no wheezes. He has no rhonchi. He has no rales.  Abdominal: Soft. Normal appearance and bowel sounds are normal. He exhibits no distension, no ascites and no mass. There is no hepatosplenomegaly. There is no tenderness. There is no rebound, no guarding and no CVA tenderness.  Musculoskeletal: Normal range of motion. He exhibits no edema or tenderness.  Lymphadenopathy:    He has no cervical adenopathy.  Neurological: He is alert and oriented to person, place, and time. He has normal strength. No cranial nerve deficit or sensory deficit.  Normal strength and sensation in all extremities, normal cerebellar test, normal gait  Skin: Skin is warm, dry and intact. No petechiae and no rash noted. He is not diaphoretic. No erythema. No pallor.  Psychiatric: He has a normal mood and affect. His behavior is normal. Judgment normal.  Nursing note and vitals reviewed.   ED Course  Procedures (including critical care time) DIAGNOSTIC STUDIES: Oxygen Saturation is 98% on RA, normal by my interpretation.    COORDINATION OF CARE: 11:31 PM-Discussed treatment plan which includes call to neurologist with pt at bedside and pt agreed to plan.   Labs Review Labs Reviewed  CBC - Abnormal; Notable for the following:    WBC 10.8 (*)    All other components within normal limits  DIFFERENTIAL - Abnormal; Notable for the following:    Monocytes Absolute 1.1 (*)    Eosinophils Relative 7 (*)    All other components within normal limits  URINALYSIS, ROUTINE W REFLEX MICROSCOPIC (NOT AT [facility]) - Abnormal; Notable for the following:    Ketones, ur 15 (*)    All other components within normal limits  PROTIME-INR  APTT  COMPREHENSIVE METABOLIC PANEL  URINE RAPID DRUG SCREEN, HOSP PERFORMED  ETHANOL  I-STAT CHEM 8, ED  I-STAT TROPOININ, ED    Imaging Review Ct Head Wo  Contrast  07/10/2015   CLINICAL DATA:  Initial valuation for acute left upper extremity numbness.  EXAM: CT HEAD WITHOUT CONTRAST  TECHNIQUE: Contiguous axial images were obtained from the base of the skull through the vertex without intravenous contrast.  COMPARISON:  None.  FINDINGS: There is no acute intracranial hemorrhage or infarct. No mass lesion or midline shift. Gray-white matter differentiation is well maintained. Ventricles are normal in size without evidence of hydrocephalus. CSF containing spaces are within normal limits. No extra-axial fluid collection. Chiari malformation noted with the cerebellar tonsils low lying in the foramen magnum.  The calvarium is intact.  Orbital soft tissues are within normal limits.  Minimal polypoid opacity within the maxillary sinuses. Minimal scattered mucosal thickening within the ethmoidal air  cells. Paranasal sinuses are otherwise clear. No mastoid effusion.  Scalp soft tissues are unremarkable.  IMPRESSION: 1. No acute intracranial process. 2. Chiari 1 malformation.   Electronically Signed   By: Rise Mu M.D.   On: 07/10/2015 00:40      EKG Interpretation   Date/Time:  Friday July 09 2015 23:51:10 EDT Ventricular Rate:  62 PR Interval:  144 QRS Duration: 87 QT Interval:  428 QTC Calculation: 435 R Axis:   16 Text Interpretation:  Sinus rhythm Baseline wander in lead(s) V3 V4 V5 No  significant change since last tracing Confirmed by Erroll Luna  [phone]) on 07/10/2015 12:27:18 AM      MDM   Final diagnoses:  None    Patient presents emergency department for left forearm and hand weakness. He states this occurred 45 minutes and spontaneously resolved. I concern for TIA. I spoke with Dr. Luisa Hart with neurology who agrees with this possible diagnosis. He recommends for admission for further evaluation.  I will page [facility] hospitalist for admission.  Please see neurology and hospitalist consult notes.  I believe the  patient should be admitted for TIA work up, however decision was made that if MRI is negative, patient is safe for DC home.  Iw ill provide neurology follow up for the patient.  No further episodes in the ED.  He appears well and in NAD.  His VS remain within his normal limits and he is safe for DC  I personally performed the services described in this documentation, which was scribed in my presence. The recorded information has been reviewed and is accurate.      Tomasita Crumble, MD 07/10/15 0500

## 2015-07-09 NOTE — ED Notes (Signed)
Patient states he experienced brief episode earlier this PM in which he had numb sensation and cramping in left hand and wrist and was unable to use his hand. States the sensation did not extend beyond the left forearm, and is not currently experiencing the problem. Denies speech problem, denies loss of bowel or bladder control. Denies pain at present, and reportedly not had this problem prior to today

## 2015-07-09 NOTE — ED Notes (Signed)
EMT reported that pt began having stroke symptoms again. EDP Oni notifed and he asked for CT to be called and have exam done sooner. CT said pt would be next in line

## 2015-07-09 NOTE — ED Notes (Signed)
Pt stated that his heel and the outer side of his Left foot is numb.

## 2015-07-09 NOTE — ED Notes (Signed)
Upon examination, sensory deficits to outside of L foot in heel area as well as near toes. EDP notifed

## 2015-07-10 ENCOUNTER — Emergency Department (HOSPITAL_COMMUNITY): Payer: BLUE CROSS/BLUE SHIELD

## 2015-07-10 ENCOUNTER — Observation Stay (HOSPITAL_COMMUNITY): Payer: BLUE CROSS/BLUE SHIELD

## 2015-07-10 DIAGNOSIS — R531 Weakness: Secondary | ICD-10-CM | POA: Diagnosis not present

## 2015-07-10 DIAGNOSIS — R202 Paresthesia of skin: Secondary | ICD-10-CM | POA: Diagnosis not present

## 2015-07-10 DIAGNOSIS — G459 Transient cerebral ischemic attack, unspecified: Secondary | ICD-10-CM | POA: Insufficient documentation

## 2015-07-10 DIAGNOSIS — R200 Anesthesia of skin: Secondary | ICD-10-CM | POA: Diagnosis present

## 2015-07-10 LAB — COMPREHENSIVE METABOLIC PANEL
ALBUMIN: 4.5 g/dL (ref 3.5–5.0)
ALT: 17 U/L (ref 17–63)
ANION GAP: 9 (ref 5–15)
AST: 21 U/L (ref 15–41)
Alkaline Phosphatase: 81 U/L (ref 38–126)
BUN: 8 mg/dL (ref 6–20)
CHLORIDE: 103 mmol/L (ref 101–111)
CO2: 27 mmol/L (ref 22–32)
Calcium: 9 mg/dL (ref 8.9–10.3)
Creatinine, Ser: 1.12 mg/dL (ref 0.61–1.24)
GFR calc Af Amer: >60 mL/min (ref >60)
GFR calc non Af Amer: >60 mL/min (ref >60)
GLUCOSE: 89 mg/dL (ref 65–99)
POTASSIUM: 4.7 mmol/L (ref 3.5–5.1)
SODIUM: 139 mmol/L (ref 135–145)
Total Bilirubin: 0.6 mg/dL (ref 0.3–1.2)
Total Protein: 7 g/dL (ref 6.5–8.1)

## 2015-07-10 LAB — APTT: APTT: 30 s (ref 24–37)

## 2015-07-10 LAB — URINALYSIS, ROUTINE W REFLEX MICROSCOPIC
Bilirubin Urine: NEGATIVE
GLUCOSE, UA: NEGATIVE mg/dL
HGB URINE DIPSTICK: NEGATIVE
KETONES UR: 15 mg/dL — AB
Leukocytes, UA: NEGATIVE
Nitrite: NEGATIVE
PH: 5.5 (ref 5.0–8.0)
PROTEIN: NEGATIVE mg/dL
Specific Gravity, Urine: 1.022 (ref 1.005–1.030)
Urobilinogen, UA: 0.2 mg/dL (ref 0.0–1.0)

## 2015-07-10 LAB — RAPID URINE DRUG SCREEN, HOSP PERFORMED
AMPHETAMINES: NOT DETECTED
BARBITURATES: NOT DETECTED
BENZODIAZEPINES: NOT DETECTED
COCAINE: NOT DETECTED
Opiates: NOT DETECTED
Tetrahydrocannabinol: NOT DETECTED

## 2015-07-10 LAB — PROTIME-INR
INR: 0.96 (ref 0.00–1.49)
PROTHROMBIN TIME: 13 s (ref 11.6–15.2)

## 2015-07-10 NOTE — ED Notes (Signed)
E-sig not available. Pt verbalized understanding of dc instruction.

## 2015-07-10 NOTE — Consult Note (Signed)
Stroke Consult    Chief Complaint: transient weakness and numbness of left arm  HPI: [name] [name] is an 54 y.o. male with hx of DVT presenting to the ED after being sent from urgent care for transient LUE numbness and weakness. Reports sitting in a movie theater when he experienced numbness in his LUE and weakness in his left hand. Reports it "didn't feel right", reports it was hard to move. Symptoms remained for around 15 minutes and then slowly got better, completely resolved after 45 minutes. He notes weakness got better with repetitive movement of the hand. Currently back to baseline. Deniesy any speech or visual symptoms. Denies any prior symptoms. No prior CVA or TIA.   CT head imaging reviewed and no acute process. Shows incidental Chiari I malformation.   Date last known well: 8/26 Time last known well: 1900 tPA Given: no, symptoms resolved Modified Rankin: Rankin Score=0  Past Medical History  Diagnosis Date  . DVT (deep venous thrombosis) 05/2012    left leg  . Achalasia   . Chest pain   . Nonspecific ST-T wave electrocardiographic changes     GXT, EF 62%  . Limb pain     venous duplex, abonormal results, some edmea    Past Surgical History  Procedure Laterality Date  . Gastric fundoplication  2002    [place]  . Heller myotomy  2002  . Gallbladder       Family History  Problem Relation Age of Onset  . Colon cancer Neg Hx   . Stomach cancer Neg Hx   . Esophageal cancer Neg Hx   . Rectal cancer Neg Hx    Social History:  reports that he has never smoked. He has never used smokeless tobacco. He reports that he does not drink alcohol or use illicit drugs.  Allergies:  Allergies  Allergen Reactions  . Bee Venom Anaphylaxis     (Not in a hospital admission)  ROS: Out of a complete 14 system review, the patient complains of only the following symptoms, and all other reviewed systems are negative. + weakness  Physical Examination: Filed Vitals:   07/10/15  0045  BP: 116/81  Pulse: 64  Temp:   Resp: 11   Physical Exam  Constitutional: He appears well-developed and well-nourished.  Psych: Affect appropriate to situation Eyes: No scleral injection HENT: No OP obstrucion Head: Normocephalic.  Cardiovascular: Normal rate and regular rhythm.  Respiratory: Effort normal and breath sounds normal.  GI: Soft. Bowel sounds are normal. No distension. There is no tenderness.  Skin: WDI  Neurologic Examination: Mental Status: Alert, oriented, thought content appropriate.  Speech fluent without evidence of aphasia.  Able to follow 3 step commands without difficulty. Cranial Nerves: II: funduscopic exam wnl bilaterally, visual fields grossly normal, pupils equal, round, reactive to light and accommodation III,IV, VI: ptosis not present, extra-ocular motions intact bilaterally V,VII: smile symmetric, facial light touch sensation normal bilaterally VIII: hearing normal bilaterally IX,X: gag reflex present XI: trapezius strength/neck flexion strength normal bilaterally XII: tongue strength normal  Motor: Right : Upper extremity    Left:     Upper extremity 5/5 deltoid       5/5 deltoid 5/5 biceps      5/5 biceps  5/5 triceps      5/5 triceps 5/5wrist flexion     5/5 wrist flexion 5/5 wrist extension     5/5 wrist extension 5/5 hand grip      5/5 hand grip  Lower extremity  Lower extremity 5/5 hip flexor      5/5 hip flexor 5/5 quadricep      5/5 quadriceps  5/5 hamstrings     5/5 hamstrings 5/5 plantar flexion       5/5 plantar flexion 5/5 plantar extension     5/5 plantar extension Tone and bulk:normal tone throughout; no atrophy noted Sensory: Pinprick and light touch intact throughout, bilaterally Deep Tendon Reflexes: 2+ and symmetric throughout Plantars: Right: downgoing   Left: downgoing Cerebellar: normal finger-to-nose, and normal heel-to-shin test Gait: deferred  Laboratory Studies:   Basic Metabolic Panel:  Recent  Labs Lab 07/09/15 2255 07/09/15 2346  NA 139 140  K 4.7 4.6  CL 103 103  CO2 27  --   GLUCOSE 89 86  BUN 8 11  CREATININE 1.12 1.10  CALCIUM 9.0  --     Liver Function Tests:  Recent Labs Lab 07/09/15 2255  AST 21  ALT 17  ALKPHOS 81  BILITOT 0.6  PROT 7.0  ALBUMIN 4.5   No results for input(s): LIPASE, AMYLASE in the last 168 hours. No results for input(s): AMMONIA in the last 168 hours.  CBC:  Recent Labs Lab 07/09/15 2255 07/09/15 2346  WBC 10.8*  --   NEUTROABS 6.1  --   HGB 15.6 16.0  HCT 46.0 47.0  MCV 91.3  --   PLT 224  --     Cardiac Enzymes: No results for input(s): CKTOTAL, CKMB, CKMBINDEX, TROPONINI in the last 168 hours.  BNP: Invalid input(s): POCBNP  CBG: No results for input(s): GLUCAP in the last 168 hours.  Microbiology: Results for orders placed or performed in visit on 05/14/12  Stool culture     Status: None   Collection Time: 05/14/12  1:12 PM  Result Value Ref Range Status   Organism ID, Bacteria No Salmonella,Shigella,Campylobacter,Yersinia,or  Final   Organism ID, Bacteria E.coli [id number]:H7 isolated  Final    Coagulation Studies:  Recent Labs  07/09/15 2255  LABPROT 13.0  INR 0.96    Urinalysis:  Recent Labs Lab 07/09/15 2355  COLORURINE YELLOW  LABSPEC 1.022  PHURINE 5.5  GLUCOSEU NEGATIVE  HGBUR NEGATIVE  BILIRUBINUR NEGATIVE  KETONESUR 15*  PROTEINUR NEGATIVE  UROBILINOGEN 0.2  NITRITE NEGATIVE  LEUKOCYTESUR NEGATIVE    Lipid Panel:  No results found for: CHOL, TRIG, HDL, CHOLHDL, VLDL, LDLCALC  HgbA1C: No results found for: HGBA1C  Urine Drug Screen:     Component Value Date/Time   LABOPIA NONE DETECTED 07/09/2015 2355   COCAINSCRNUR NONE DETECTED 07/09/2015 2355   LABBENZ NONE DETECTED 07/09/2015 2355   AMPHETMU NONE DETECTED 07/09/2015 2355   THCU NONE DETECTED 07/09/2015 2355   LABBARB NONE DETECTED 07/09/2015 2355    Alcohol Level: No results for input(s): ETH in the last 168  hours.  Other results: EKG shows NSR  Imaging: Ct Head Wo Contrast  07/10/2015   CLINICAL DATA:  Initial valuation for acute left upper extremity numbness.  EXAM: CT HEAD WITHOUT CONTRAST  TECHNIQUE: Contiguous axial images were obtained from the base of the skull through the vertex without intravenous contrast.  COMPARISON:  None.  FINDINGS: There is no acute intracranial hemorrhage or infarct. No mass lesion or midline shift. Gray-white matter differentiation is well maintained. Ventricles are normal in size without evidence of hydrocephalus. CSF containing spaces are within normal limits. No extra-axial fluid collection. Chiari malformation noted with the cerebellar tonsils low lying in the foramen magnum.  The calvarium is intact.  Orbital soft  tissues are within normal limits.  Minimal polypoid opacity within the maxillary sinuses. Minimal scattered mucosal thickening within the ethmoidal air cells. Paranasal sinuses are otherwise clear. No mastoid effusion.  Scalp soft tissues are unremarkable.  IMPRESSION: 1. No acute intracranial process. 2. Chiari 1 malformation.   Electronically Signed   By: Rise Mu M.D.   On: 07/10/2015 00:40    Assessment: 54 y.o. male hx of prior DVT presenting with transient episode of distal LUE numbness and impaired mobility. Currently asymptomatic. Differential includes TIA vs compressive peripheral nerve lesion. With lack of risk factors he is low risk if this does turn out to be a TIA. Will plan for MRI brain.   Extensively discussed the possibility of TIA vs peripheral nerve irritation with patient and his family. They expressed understanding of possible diagnosis. If MRI negative for acute stroke offered them the option of admission for TIA workup vs outpatient follow up. Counseled them on risks of discharge and they expressed understanding. If MRI brain negative will plan for outpatient follow up to complete TIA workup.   Patients CT scan shows Chiari  I malformation which is likely incidental and not related to presenting symptoms.   -MRI brain. If positive complete stroke workup -ASA  daily -will order lipid panel and hemoglobin A1c.  -follow up with PCP to review above lab work and complete TTE and carotid doppler -counseled on strict return to ED instructions if symptoms return   Elspeth Cho, DO [facility] [phone]  If 7pm- 7am, please page neurology on call as listed in AMION. 07/10/2015, 1:08 AM

## 2015-07-10 NOTE — ED Notes (Signed)
Spoke with Kerby Nora MD, does not want pt to get admitted, wants to get MRI done. Called back to nurse on unit and informed of decision

## 2015-07-10 NOTE — Consult Note (Signed)
[facility] Medical Consultation  [name] [name]:[id number] DOB: 02-13-1961 DOA: 07/09/2015 PCP: Nestor Ramp   Requesting physician: Dr. Mora Bellman Date of consultation: 07/10/15 Reason for consultation: Transient hand numbness  Impression/Recommendations Principal Problem:   Numbness and tingling in left hand    1. Numbness and tingling in left hand - 1. Normal neuro exam currently 2. See Dr. Marguerita Merles note: 1. I agree that patient is low risk for this having been a TIA 2. I agree with Dr. Hosie Poisson that if MRI is negative, it is reasonable to follow up outpatient for the remainder of TIA testing (2d echo, carotid doppler, etc) 3. Currently getting MRI of brain, if positive will admit.    Chief Complaint: Transient hand numbness  HPI:  54 yo M with h/o DVT, patient presents to the ED after being sent from UC for transient LUE hand numbness and weakness.  He had been sitting in a movie theater when he experienced numbness in his LUE.  Symptoms remained for 15 mins and slowly got better.  Complete resolution after 45 mins.  Currently back to baseline.  Symptoms got better with repetitive movement of hand.  Patient has no h/o CVA nor TIA.  He has no history of HTN, smoking, DM, high cholesterol (though he did have high triglicerides he thinks).  Had chest pain once back in 2008, stress test and ultimately a heart cath.  Heart cath showed no disease in any vessels.  No family history of early strokes.  Review of Systems:  12 systems reviewed and otherwise negative  Past Medical History  Diagnosis Date  . DVT (deep venous thrombosis) 05/2012    left leg  . Achalasia   . Chest pain   . Nonspecific ST-T wave electrocardiographic changes     GXT, EF 62%  . Limb pain     venous duplex, abonormal results, some edmea   Past Surgical History  Procedure Laterality Date  . Gastric fundoplication  2002    [place]  . Heller myotomy  2002  . Gallbladder      Social  History:  reports that he has never smoked. He has never used smokeless tobacco. He reports that he does not drink alcohol or use illicit drugs.  Allergies  Allergen Reactions  . Bee Venom Anaphylaxis   Family History  Problem Relation Age of Onset  . Colon cancer Neg Hx   . Stomach cancer Neg Hx   . Esophageal cancer Neg Hx   . Rectal cancer Neg Hx     Prior to Admission medications   Medication Sig Start Date End Date Taking? Authorizing Provider  ciprofloxacin (CIPRO) 500 MG tablet Take 1 tablet (500 mg total) by mouth 2 (two) times daily. 05/10/15   Carmelina Dane, MD  EPINEPHrine (EPIPEN 2-PAK) 0.3 mg/0.3 mL SOAJ injection Inject 0.3 mLs (0.3 mg total) into the muscle once. Patient not taking: Reported on 05/10/2015 08/22/13   Jonita Albee, MD  metroNIDAZOLE (FLAGYL) 500 MG tablet Take 1 tablet (500 mg total) by mouth 4 (four) times daily. 05/10/15   Carmelina Dane, MD  neomycin-polymyxin-hydrocortisone (CORTISPORIN) otic solution Place 4 drops into the left ear 4 (four) times daily. Patient not taking: Reported on 05/10/2015 08/22/13   Porfirio Oar, PA-C   Physical Exam: Blood pressure 127/85, pulse 70, temperature 98.6 F (37 C), temperature source Oral, resp. rate 25, SpO2 97 %. Filed Vitals:   07/10/15 0145  BP: 127/85  Pulse: 70  Temp:  Resp: 25     General:  NAD, resting comfortably in bed  Eyes: PEERLA EOMI  ENT: mucous membranes moist  Neck: supple w/o JVD  Cardiovascular: RRR w/o MRG  Respiratory: CTA B  Abdomen: soft, nt, nd, bs+  Skin: no rash nor lesion  Musculoskeletal: MAE, full ROM all 4 extremities  Psychiatric: normal tone and affect  Neurologic: AAOx3, grossly non-focal   Labs on Admission:  Basic Metabolic Panel:  Recent Labs Lab 07/09/15 2255 07/09/15 2346  NA 139 140  K 4.7 4.6  CL 103 103  CO2 27  --   GLUCOSE 89 86  BUN 8 11  CREATININE 1.12 1.10  CALCIUM 9.0  --    Liver Function Tests:  Recent Labs Lab  07/09/15 2255  AST 21  ALT 17  ALKPHOS 81  BILITOT 0.6  PROT 7.0  ALBUMIN 4.5   No results for input(s): LIPASE, AMYLASE in the last 168 hours. No results for input(s): AMMONIA in the last 168 hours. CBC:  Recent Labs Lab 07/09/15 2255 07/09/15 2346  WBC 10.8*  --   NEUTROABS 6.1  --   HGB 15.6 16.0  HCT 46.0 47.0  MCV 91.3  --   PLT 224  --    Cardiac Enzymes: No results for input(s): CKTOTAL, CKMB, CKMBINDEX, TROPONINI in the last 168 hours. BNP: Invalid input(s): POCBNP CBG: No results for input(s): GLUCAP in the last 168 hours.  Radiological Exams on Admission: Ct Head Wo Contrast  07/10/2015   CLINICAL DATA:  Initial valuation for acute left upper extremity numbness.  EXAM: CT HEAD WITHOUT CONTRAST  TECHNIQUE: Contiguous axial images were obtained from the base of the skull through the vertex without intravenous contrast.  COMPARISON:  None.  FINDINGS: There is no acute intracranial hemorrhage or infarct. No mass lesion or midline shift. Gray-white matter differentiation is well maintained. Ventricles are normal in size without evidence of hydrocephalus. CSF containing spaces are within normal limits. No extra-axial fluid collection. Chiari malformation noted with the cerebellar tonsils low lying in the foramen magnum.  The calvarium is intact.  Orbital soft tissues are within normal limits.  Minimal polypoid opacity within the maxillary sinuses. Minimal scattered mucosal thickening within the ethmoidal air cells. Paranasal sinuses are otherwise clear. No mastoid effusion.  Scalp soft tissues are unremarkable.  IMPRESSION: 1. No acute intracranial process. 2. Chiari 1 malformation.   Electronically Signed   By: Rise Mu M.D.   On: 07/10/2015 00:40    EKG: Independently reviewed.   Time spent: 60 min  GARDNER, JARED M. [facility] Pager [phone]  If 7PM-7AM, please contact night-coverage [email or web address] Password [facility] 07/10/2015, 3:28  AM

## 2015-07-10 NOTE — ED Notes (Signed)
Attempted report X1

## 2015-07-10 NOTE — ED Notes (Signed)
Pt and family still confused about pt dx. [facility] EDP notified and will go explain

## 2015-07-10 NOTE — ED Notes (Signed)
Pt to MRI

## 2015-07-10 NOTE — Discharge Instructions (Signed)
Transient Ischemic Attack [name] [name], your MRI does not show any signs of stroke.  See neurology within 3 days for close follow up.  If symptoms worsen, come back to the ED immediately.  Thank you.   A transient ischemic attack (TIA) is a "warning stroke" that causes stroke-like symptoms. A TIA does not cause lasting damage to the brain. It is important to know when to get help and what to do to prevent stroke or death.  HOME CARE   Take all medicines exactly as told by your doctor. Understand all your medicine instructions.  You may need to take aspirin or warfarin medicine. Take warfarin exactly as told.  Taking too much or too little warfarin is dangerous. Blood tests must be done as often as told by your doctor. These blood tests help your doctor make sure the amount of warfarin you are taking is right. A PT blood test measures how long it takes for blood to clot. Your PT is used to calculate another value called an INR. Your PT and INR help your doctor adjust your warfarin dosage.  Food can cause problems with warfarin and affect the results of your blood tests. This is true for foods high in vitamin K. Spinach, kale, broccoli, cabbage, collard and turnip greens, Brussels sprouts, peas, cauliflower, seaweed, and parsley are high in vitamin K as well as beef and pork liver, green tea, and soybean oil. Eat the same amount of food high in vitamin K. Avoid major changes in your diet. Tell your doctor before changing your diet. Talk to a food specialist (dietitian) if you have questions.  Many medicines can cause problems with warfarin and affect your PT and INR. Tell your doctor about all medicines you take. This includes vitamins and dietary pills (supplements). Be careful with aspirin and medicines that relieve redness, soreness, and puffiness (inflammation). Do not take or stop medicines unless your doctor tells you to.  Warfarin can cause a lot of bruising or bleeding. Hold pressure over cuts  for longer than normal. Talk to your doctor about other side effects of warfarin.  Avoid sports or activities that may cause injury or bleeding.  Be careful when you shave, floss your teeth, or use sharp objects.  Avoid alcoholic drinks or drink very little alcohol while taking warfarin. Tell your doctor if you change how much alcohol you drink.  Tell your dentist and other doctors that you take warfarin before procedures.  Eat 5 or more servings of fruits and vegetables a day.  Follow your diet program as told, if you are given one.  Keep a healthy weight.  Stay active. Try to get at least 30 minutes of activity on most or all days.  Do not smoke.  Limit how much alcohol you drink even if you are not taking warfarin. Moderate alcohol use is:  No more than 2 drinks each day for men.  No more than 1 drink each day for women who are not pregnant.  Stop abusing drugs.  Keep your home safe so you do not fall. Try:  Putting grab bars in the bedroom and bathroom.  Raising toilet seats.  Putting a seat in the shower.  Keep all doctor visits a told. GET HELP IF:  Your personality changes.  You have trouble swallowing.  You are seeing two of everything.  You are dizzy.  You have a fever.  Your skin starts to break down. GET HELP RIGHT AWAY IF:  The symptoms below may be  a sign of an emergency. Do not wait to see if the symptoms go away. Call for help (911 in [place] Do not drive yourself to the hospital.  You have sudden weakness or numbness on the face, arm, or leg (especially on one side of the body).  You have sudden trouble walking or moving your arms or legs.  You have sudden confusion.  You have trouble talking or understanding.  You have sudden trouble seeing in one or both eyes.  You lose your balance or your movements are not smooth.  You have a sudden, severe headache with no known cause.  You have new chest pain or you feel your heart beating in a  unsteady way.  You are partly or totally unaware of what is going on around you. MAKE SURE YOU:   Understand these instructions.  Will watch your condition.  Will get help right away if you are not doing well or get worse. Document Released: 08/08/2008 Document Revised: 03/16/2014 Document Reviewed: 02/04/2014 [facility] Patient Information 2015 [place], [place]. This information is not intended to replace advice given to you by your health care provider. Make sure you discuss any questions you have with your health care provider.

## 2015-08-20 ENCOUNTER — Encounter: Payer: Self-pay | Admitting: Neurology

## 2015-08-20 ENCOUNTER — Ambulatory Visit (INDEPENDENT_AMBULATORY_CARE_PROVIDER_SITE_OTHER): Payer: BLUE CROSS/BLUE SHIELD | Admitting: Neurology

## 2015-08-20 VITALS — BP 128/84 | HR 68 | Ht 67.0 in | Wt 216.3 lb

## 2015-08-20 DIAGNOSIS — Z86718 Personal history of other venous thrombosis and embolism: Secondary | ICD-10-CM

## 2015-08-20 DIAGNOSIS — G458 Other transient cerebral ischemic attacks and related syndromes: Secondary | ICD-10-CM | POA: Diagnosis not present

## 2015-08-20 NOTE — Progress Notes (Signed)
NEUROLOGY CONSULTATION NOTE  SC[place] KORVER MRN: [id number] DOB: 04-24-1961  Referring provider: ED referral Primary care provider: Dr. Patsy Lager  Reason for consult:  TIA  HISTORY OF PRESENT ILLNESS: [name] [name] is a 54 year old right-handed male with achalasia and history of DVT in left leg who presents for TIA.  History obtained by patient and ED notes.  Images of CT and MRI of brain reviewed.  Labs reviewed.  On 07/09/15, he was sitting in a chair about to watch a movie when he developed sudden-onset numbness of the left arm and hand.  He wasn't able to shake the sensation out.  He had difficulty moving his hand and arm.  Symptoms lasted about 45 minutes.  He did not have slurred speech, facial droop, dizziness, visual disturbance or headache.  He presented to Urgent Care and then the ED that day.  CT and MRI of the brain were unremarkable and did not show acute infarct.  CBC, CMP, urine drug screen, UA, and troponins were negative.  EKG was unremarkable.  He was advised to start aspirin  daily.  In July 2013, he developed a DVT in his left leg.  He was on Xarelto for 3 months.  There is no family history of stroke.  However, his paternal grandfather once had a "blood clot" in the leg.  His aunt had carotid artery disease. PAST MEDICAL HISTORY: Past Medical History  Diagnosis Date  . DVT (deep venous thrombosis) (HCC) 05/2012    left leg  . Achalasia   . Chest pain   . Nonspecific ST-T wave electrocardiographic changes     GXT, EF 62%  . Limb pain     venous duplex, abonormal results, some edmea    PAST SURGICAL HISTORY: Past Surgical History  Procedure Laterality Date  . Gastric fundoplication  2002    Toupet  . Heller myotomy  2002  . Gallbladder       MEDICATIONS: No current outpatient prescriptions on file prior to visit.   No current facility-administered medications on file prior to visit.    ALLERGIES: Allergies  Allergen Reactions  . Bee Venom  Anaphylaxis    FAMILY HISTORY: Family History  Problem Relation Age of Onset  . Colon cancer Neg Hx   . Stomach cancer Neg Hx   . Esophageal cancer Neg Hx   . Rectal cancer Neg Hx     SOCIAL HISTORY: Social History   Social History  . Marital Status: Married    Spouse Name: N/A  . Number of Children: N/A  . Years of Education: N/A   Occupational History  . Not on file.   Social History Main Topics  . Smoking status: Never Smoker   . Smokeless tobacco: Never Used  . Alcohol Use: No  . Drug Use: No  . Sexual Activity: Not on file   Other Topics Concern  . Not on file   Social History Narrative    REVIEW OF SYSTEMS: Constitutional: No fevers, chills, or sweats, no generalized fatigue, change in appetite Eyes: No visual changes, double vision, eye pain Ear, nose and throat: No hearing loss, ear pain, nasal congestion, sore throat Cardiovascular: No chest pain, palpitations Respiratory:  No shortness of breath at rest or with exertion, wheezes GastrointestinaI: No nausea, vomiting, diarrhea, abdominal pain, fecal incontinence Genitourinary:  No dysuria, urinary retention or frequency Musculoskeletal:  No neck pain, back pain Integumentary: No rash, pruritus, skin lesions Neurological: as above Psychiatric: No depression, insomnia, anxiety Endocrine: No  palpitations, fatigue, diaphoresis, mood swings, change in appetite, change in weight, increased thirst Hematologic/Lymphatic:  No anemia, purpura, petechiae. Allergic/Immunologic: no itchy/runny eyes, nasal congestion, recent allergic reactions, rashes  PHYSICAL EXAM: Filed Vitals:   08/20/15 0801  BP: 128/84  Pulse: 68   General: No acute distress.  Patient appears well-groomed.  Head:  Normocephalic/atraumatic Eyes:  fundi unremarkable, without vessel changes, exudates, hemorrhages or papilledema. Neck: supple, no paraspinal tenderness, full range of motion Back: No paraspinal tenderness Heart: regular rate  and rhythm Lungs: Clear to auscultation bilaterally. Vascular: No carotid bruits. Neurological Exam: Mental status: alert and oriented to person, place, and time, recent and remote memory intact, fund of knowledge intact, attention and concentration intact, speech fluent and not dysarthric, language intact. Cranial nerves: CN I: not tested CN II: pupils equal, round and reactive to light, visual fields intact, fundi unremarkable, without vessel changes, exudates, hemorrhages or papilledema. CN III, IV, VI:  full range of motion, no nystagmus, no ptosis CN V: facial sensation intact CN VII: upper and lower face symmetric CN VIII: hearing intact CN IX, X: gag intact, uvula midline CN XI: sternocleidomastoid and trapezius muscles intact CN XII: tongue midline Bulk & Tone: normal, no fasciculations. Motor:  5/5 throughout. Sensation: temperature and vibration sensation intact. Deep Tendon Reflexes:  2+ throughout, toes downgoing.  Finger to nose testing:  Without dysmetria.  Heel to shin:  Without dysmetria.  Gait:  Normal station and stride.  Able to turn and tandem walk. Romberg negative.  IMPRESSION: Transient ischemic attack History of DVT  PLAN: 1.  Continue ASA  daily 2.  Check fasting lipid panel 3.  Given history of DVT, will check hypercoagulable panel 4.  Check carotid doppler and echocardiogram with bubble study 5.  Mediterranean diet 6.  He should follow up with Dr. Patsy Lager.  I would have Dr. Patsy Lager check a Hgb A1c.  He hasn't seen his PCP since the DVT.  EPIC lists his PCP as Beaulah Corin, however it appears that he saw Shanda Bumps Copland in 2013 after the DVT. 7.  Follow up in 3 months.  Thank you for allowing me to take part in the care of this patient.  Shon Millet, DO  CC:  Abbe Amsterdam, MD

## 2015-08-20 NOTE — Patient Instructions (Signed)
Continue aspirin  daily Will check carotid doppler Will check 2d echocardiogram with bubble study Will check fasting lipid panel and hypercoagulable workup Mediterranean diet Follow up with Dr. Patsy Lager.  Have her check a hemoglobin A1c as well. Follow up with me in 3 months.

## 2015-08-20 NOTE — Addendum Note (Signed)
Addended by: Franciso Bend on: 08/20/2015 08:59 AM   Modules accepted: Orders

## 2015-08-26 ENCOUNTER — Ambulatory Visit (HOSPITAL_COMMUNITY)
Admission: RE | Admit: 2015-08-26 | Discharge: 2015-08-26 | Disposition: A | Discharge status: Home / Self Care | Payer: BLUE CROSS/BLUE SHIELD | Source: Ambulatory Visit | Attending: Neurology | Admitting: Neurology

## 2015-08-26 DIAGNOSIS — G458 Other transient cerebral ischemic attacks and related syndromes: Secondary | ICD-10-CM

## 2015-08-27 ENCOUNTER — Telehealth: Payer: Self-pay | Admitting: Neurology

## 2015-08-27 NOTE — Telephone Encounter (Signed)
Error

## 2015-08-30 ENCOUNTER — Other Ambulatory Visit: Payer: Self-pay | Admitting: *Deleted

## 2015-08-30 ENCOUNTER — Telehealth: Payer: Self-pay | Admitting: Neurology

## 2015-08-30 ENCOUNTER — Telehealth: Payer: Self-pay | Admitting: *Deleted

## 2015-08-30 ENCOUNTER — Other Ambulatory Visit: Payer: Self-pay

## 2015-08-30 ENCOUNTER — Ambulatory Visit (HOSPITAL_COMMUNITY): Payer: BLUE CROSS/BLUE SHIELD | Attending: Cardiovascular Disease

## 2015-08-30 DIAGNOSIS — I517 Cardiomegaly: Secondary | ICD-10-CM | POA: Diagnosis not present

## 2015-08-30 DIAGNOSIS — I071 Rheumatic tricuspid insufficiency: Secondary | ICD-10-CM | POA: Insufficient documentation

## 2015-08-30 DIAGNOSIS — G459 Transient cerebral ischemic attack, unspecified: Secondary | ICD-10-CM | POA: Insufficient documentation

## 2015-08-30 DIAGNOSIS — I340 Nonrheumatic mitral (valve) insufficiency: Secondary | ICD-10-CM | POA: Insufficient documentation

## 2015-08-30 DIAGNOSIS — G458 Other transient cerebral ischemic attacks and related syndromes: Secondary | ICD-10-CM | POA: Diagnosis not present

## 2015-08-30 MED ORDER — ATORVASTATIN CALCIUM 40 MG PO TABS: 40.0000 mg | ORAL_TABLET | Freq: Every day | ORAL | Status: DC

## 2015-08-30 NOTE — Telephone Encounter (Signed)
-----   Message from Drema DallasAdam R Jaffe, DO sent at 08/30/2015  6:39 AM EDT ----- No, my advice is to start a statin.  It is recommended that he should start a statin after a TIA. ----- Message -----    From: Vonzell SchlatterAshley H Leora Platt, LPN    Sent: 78/29/562110/14/2016   4:44 PM      To: Drema DallasAdam R Jaffe, DO  Patient would like to try diet and exercise first.  Is that ok? ----- Message -----    From: Drema DallasAdam R Jaffe, DO    Sent: 08/27/2015  11:56 AM      To: Drema DallasAdam R Jaffe, DO, Vonzell SchlatterAshley H Jensine Luz, LPN  Please let patient know that his LDL is 142, which is elevated.  I would like to start him on Lipitor 40mg  daily and recheck lipid panel in 3 months.

## 2015-08-30 NOTE — Telephone Encounter (Signed)
Called patient and informed him that Dr. Everlena CooperJaffe really would like for him to take the Lipitor.  Will call in Rx to [facility] on [address]

## 2015-08-30 NOTE — Telephone Encounter (Signed)
Informed patient that I will mail him a copy when they come in.

## 2015-08-30 NOTE — Telephone Encounter (Signed)
Pt called for the lab report info that was missing//call back @ [phone]

## 2015-08-31 ENCOUNTER — Telehealth: Payer: Self-pay | Admitting: Family Medicine

## 2015-08-31 NOTE — Telephone Encounter (Signed)
-----   Message from Drema DallasAdam R Jaffe, DO sent at 08/30/2015  3:03 PM EDT ----- Elmarie Shileyiffany, could you please let Mr. Lyda JesterCurtis know that his echocardiogram looks okay?  Thank you

## 2015-08-31 NOTE — Telephone Encounter (Signed)
Patient was notified of result. 

## 2015-09-08 ENCOUNTER — Telehealth: Payer: Self-pay

## 2015-09-08 NOTE — Telephone Encounter (Signed)
Patient is on already on Lipitor. States he started it on Monday, no side effects. Is working on diet.

## 2015-09-08 NOTE — Telephone Encounter (Signed)
-----   Message from Drema DallasAdam R Jaffe, DO sent at 09/08/2015  7:29 AM EDT ----- Blood work showed that his LDL (bad cholesterol) is 142.  I would like it to be less than 100.  Therefore, I recommend starting simvastatin 10mg  daily.

## 2015-10-12 ENCOUNTER — Encounter: Payer: Self-pay | Admitting: Neurology

## 2015-11-22 ENCOUNTER — Other Ambulatory Visit: Payer: Self-pay | Admitting: Neurology

## 2015-11-22 NOTE — Telephone Encounter (Signed)
Last OV: 08/20/15 Next OV: 11/26/15

## 2015-11-23 ENCOUNTER — Encounter: Payer: BLUE CROSS/BLUE SHIELD | Admitting: Family Medicine

## 2015-11-23 ENCOUNTER — Encounter: Payer: Self-pay | Admitting: Family Medicine

## 2015-11-23 ENCOUNTER — Ambulatory Visit (INDEPENDENT_AMBULATORY_CARE_PROVIDER_SITE_OTHER): Payer: BLUE CROSS/BLUE SHIELD | Admitting: Family Medicine

## 2015-11-23 VITALS — BP 119/79 | HR 62 | Temp 98.1°F | Resp 16 | Ht 67.5 in | Wt 202.0 lb

## 2015-11-23 DIAGNOSIS — Z0000 Encounter for general adult medical examination without abnormal findings: Secondary | ICD-10-CM

## 2015-11-23 DIAGNOSIS — E785 Hyperlipidemia, unspecified: Secondary | ICD-10-CM

## 2015-11-23 DIAGNOSIS — R21 Rash and other nonspecific skin eruption: Secondary | ICD-10-CM

## 2015-11-23 DIAGNOSIS — Z114 Encounter for screening for human immunodeficiency virus [HIV]: Secondary | ICD-10-CM

## 2015-11-23 DIAGNOSIS — Z23 Encounter for immunization: Secondary | ICD-10-CM | POA: Diagnosis not present

## 2015-11-23 DIAGNOSIS — Z1159 Encounter for screening for other viral diseases: Secondary | ICD-10-CM | POA: Diagnosis not present

## 2015-11-23 DIAGNOSIS — Z125 Encounter for screening for malignant neoplasm of prostate: Secondary | ICD-10-CM | POA: Diagnosis not present

## 2015-11-23 LAB — COMPLETE METABOLIC PANEL WITH GFR
ALBUMIN: 4.9 g/dL (ref 3.6–5.1)
ALK PHOS: 88 U/L (ref 40–115)
ALT: 25 U/L (ref 9–46)
AST: 19 U/L (ref 10–35)
BUN: 12 mg/dL (ref 7–25)
CHLORIDE: 104 mmol/L (ref 98–110)
CO2: 27 mmol/L (ref 20–31)
Calcium: 9.5 mg/dL (ref 8.6–10.3)
Creat: 1 mg/dL (ref 0.70–1.33)
GFR, Est African American: >89 mL/min (ref >=60)
GFR, Est Non African American: 85 mL/min (ref >=60)
GLUCOSE: 83 mg/dL (ref 65–99)
POTASSIUM: 4.4 mmol/L (ref 3.5–5.3)
SODIUM: 140 mmol/L (ref 135–146)
Total Bilirubin: 0.9 mg/dL (ref 0.2–1.2)
Total Protein: 7.1 g/dL (ref 6.1–8.1)

## 2015-11-23 LAB — LIPID PANEL
CHOLESTEROL: 146 mg/dL (ref 125–200)
HDL: 56 mg/dL (ref >=40)
LDL Cholesterol: 53 mg/dL (ref <130)
Total CHOL/HDL Ratio: 2.6 Ratio (ref <=5.0)
Triglycerides: 184 mg/dL — ABNORMAL HIGH (ref <150)
VLDL: 37 mg/dL — AB (ref <30)

## 2015-11-23 MED ORDER — BETAMETHASONE DIPROPIONATE 0.05 % EX CREA: TOPICAL_CREAM | Freq: Two times a day (BID) | CUTANEOUS | Status: AC

## 2015-11-23 NOTE — Patient Instructions (Signed)
Consider adding yoga to your exercise regimen [email or web address]     Why follow it? Research shows. . Those who follow the Mediterranean diet have a reduced risk of heart disease  . The diet is associated with a reduced incidence of Parkinson's and Alzheimer's diseases . People following the diet may have longer life expectancies and lower rates of chronic diseases  . The Dietary Guidelines for Americans recommends the Mediterranean diet as an eating plan to promote health and prevent disease  What Is the Mediterranean Diet?  . Healthy eating plan based on typical foods and recipes of Mediterranean-style cooking . The diet is primarily a plant based diet; these foods should make up a majority of meals   Starches - Plant based foods should make up a majority of meals - They are an important sources of vitamins, minerals, energy, antioxidants, and fiber - Choose whole grains, foods high in fiber and minimally processed items  - Typical grain sources include wheat, oats, barley, corn, brown rice, bulgar, farro, millet, polenta, couscous  - Various types of beans include chickpeas, lentils, fava beans, black beans, white beans   Fruits  Veggies - Large quantities of antioxidant rich fruits & veggies; 6 or more servings  - Vegetables can be eaten raw or lightly drizzled with oil and cooked  - Vegetables common to the traditional Mediterranean Diet include: artichokes, arugula, beets, broccoli, brussel sprouts, cabbage, carrots, celery, collard greens, cucumbers, eggplant, kale, leeks, lemons, lettuce, mushrooms, okra, onions, peas, peppers, potatoes, pumpkin, radishes, rutabaga, shallots, spinach, sweet potatoes, turnips, zucchini - Fruits common to the Mediterranean Diet include: apples, apricots, avocados, cherries, clementines, dates, figs, grapefruits, grapes, melons, nectarines, oranges, peaches, pears, pomegranates, strawberries, tangerines  Fats - Replace butter and margarine with healthy  oils, such as olive oil, canola oil, and tahini  - Limit nuts to no more than a handful a day  - Nuts include walnuts, almonds, pecans, pistachios, pine nuts  - Limit or avoid candied, honey roasted or heavily salted nuts - Olives are central to the [employer]iterranean diet - can be eaten whole or used in a variety of dishes   Meats Protein - Limiting red meat: no more than a few times a month - When eating red meat: choose lean cuts and keep the portion to the size of deck of cards - Eggs: approx. 0 to 4 times a week  - Fish and lean poultry: at least 2 a week  - Healthy protein sources include, chicken, [place], lean beef, lamb - Increase intake of seafood such as tuna, salmon, trout, mackerel, shrimp, scallops - Avoid or limit high fat processed meats such as sausage and bacon  Dairy - Include moderate amounts of low fat dairy products  - Focus on healthy dairy such as fat free yogurt, skim milk, low or reduced fat cheese - Limit dairy products higher in fat such as whole or 2% milk, cheese, ice cream  Alcohol - Moderate amounts of red wine is ok  - No more than 5 oz daily for women (all ages) and men older than age 55  - No more than 10 oz of wine daily for men younger than 3865  Other - Limit sweets and other desserts  - Use herbs and spices instead of salt to flavor foods  - Herbs and spices common to the traditional Mediterranean Diet include: basil, bay leaves, chives, cloves, cumin, fennel, garlic, lavender, marjoram, mint, oregano, parsley, pepper, rosemary, sage, savory, sumac, tarragon, thyme   It's not  just a diet, it's a lifestyle:  . The Mediterranean diet includes lifestyle factors typical of those in the region  . Foods, drinks and meals are best eaten with others and savored . Daily physical activity is important for overall good health . This could be strenuous exercise like running and aerobics . This could also be more leisurely activities such as walking, housework, yard-work,  or taking the stairs . Moderation is the key; a balanced and healthy diet accommodates most foods and drinks . Consider portion sizes and frequency of consumption of certain foods   Meal Ideas & Options:  . Breakfast:  o Whole wheat toast or whole wheat English muffins with peanut butter & hard boiled egg o Steel cut oats topped with apples & cinnamon and skim milk  o Fresh fruit: banana, strawberries, melon, berries, peaches  o Smoothies: strawberries, bananas, greek yogurt, peanut butter o Low fat greek yogurt with blueberries and granola  o Egg white omelet with spinach and mushrooms o Breakfast couscous: whole wheat couscous, apricots, skim milk, cranberries  . Sandwiches:  o Hummus and grilled vegetables (peppers, zucchini, squash) on whole wheat bread   o Grilled chicken on whole wheat pita with lettuce, tomatoes, cucumbers or tzatziki  o Tuna salad on whole wheat bread: tuna salad made with greek yogurt, olives, red peppers, capers, green onions o Garlic rosemary lamb pita: lamb sauted with garlic, rosemary, salt & pepper; add lettuce, cucumber, greek yogurt to pita - flavor with lemon juice and black pepper  . Seafood:  o Mediterranean grilled salmon, seasoned with garlic, basil, parsley, lemon juice and black pepper o Shrimp, lemon, and spinach whole-grain pasta salad made with low fat greek yogurt  o Seared scallops with lemon orzo  o Seared tuna steaks seasoned salt, pepper, coriander topped with tomato mixture of olives, tomatoes, olive oil, minced garlic, parsley, green onions and cappers  . Meats:  o Herbed greek chicken salad with kalamata olives, cucumber, feta  o Red bell peppers stuffed with spinach, bulgur, lean ground beef (or lentils) & topped with feta   o Kebabs: skewers of chicken, tomatoes, onions, zucchini, squash  o [place] burgers: made with red onions, mint, dill, lemon juice, feta cheese topped with roasted red peppers . Vegetarian o Cucumber salad:  cucumbers, artichoke hearts, celery, red onion, feta cheese, tossed in olive oil & lemon juice  o Hummus and whole grain pita points with a greek salad (lettuce, tomato, feta, olives, cucumbers, red onion) o Lentil soup with celery, carrots made with vegetable broth, garlic, salt and pepper  o Tabouli salad: parsley, bulgur, mint, scallions, cucumbers, tomato, radishes, lemon juice, olive oil, salt and pepper.

## 2015-11-23 NOTE — Progress Notes (Signed)
Subjective:    Patient ID: [name] [name], male    DOB: [birth year], 55 y.o.   MRN: [id number]  HPI This is a pleasant 55 yo male who presents today for CPE. He is married, has grown children and works in [employer]ing [employer]. His job is very boring and he is looking for a new job. He has been working on his diet and getting in at least 10,000 steps daily. Has lost 14 pounds since 10/16. He had a TIA 8/16 with negative workup (MRI, echo, hypercoagulable panel) by Dr. Everlena CooperJaffe. He was started on atorvastatin for elevated lipids. He has follow up with Dr. Everlena CooperJaffe later this week.   Last CPE- several years PSA- never Colonoscopy- 2013 Tdap- 11/23/15 Flu- declines Dental- yes Eye- 2011 Exercise- walks  Past Medical History  Diagnosis Date  . DVT (deep venous thrombosis) (HCC) 05/2012    left leg  . Achalasia   . Chest pain   . Nonspecific ST-T wave electrocardiographic changes     GXT, EF 62%  . Limb pain     venous duplex, abonormal results, some edmea   Past Surgical History  Procedure Laterality Date  . Gastric fundoplication  2002    Toupet  . Heller myotomy  2002  . Gallbladder     . Cholecystectomy     Family History  Problem Relation Age of Onset  . Colon cancer Neg Hx   . Stomach cancer Neg Hx   . Esophageal cancer Neg Hx   . Rectal cancer Neg Hx    Social History  Substance Use Topics  . Smoking status: Never Smoker   . Smokeless tobacco: Never Used  . Alcohol Use: No   Review of Systems  Constitutional: Negative.   HENT: Negative.   Eyes: Negative.   Respiratory: Negative.   Gastrointestinal: Negative.   Endocrine: Negative.   Genitourinary: Negative.   Musculoskeletal: Negative.   Skin: Positive for rash (one year history of recurring bumps on his fingers. Preceeded by itching then has bump that scales. Resolves eventually with OTC hydrocortisone cream. ).  Allergic/Immunologic: Negative.   Neurological: Negative.   Hematological: Negative.   Psychiatric/Behavioral:  Negative.       Objective:   Physical Exam Physical Exam  Constitutional: He is oriented to person, place, and time. He appears well-developed and well-nourished.  HENT:  Head: Normocephalic and atraumatic.  Right Ear: External ear normal.  Left Ear: External ear normal.  Nose: Nose normal.  Mouth/Throat: Oropharynx is clear and moist.  Eyes: Conjunctivae are normal. Pupils are equal, round, and reactive to light.  Neck: Normal range of motion. Neck supple.  Cardiovascular: Normal rate, regular rhythm, normal heart sounds and intact distal pulses.   Pulmonary/Chest: Effort normal and breath sounds normal.  Abdominal: Soft. Bowel sounds are normal. Hernia confirmed negative in the right inguinal area and confirmed negative in the left inguinal area.  Genitourinary: Testes normal and penis normal. Circumcised.  Musculoskeletal: Normal range of motion. He exhibits no edema or tenderness.       Cervical back: Normal.       Thoracic back: Normal.       Lumbar back: Normal.  Lymphadenopathy:    He has no cervical adenopathy.       Right: No inguinal adenopathy present.       Left: No inguinal adenopathy present.  Neurological: He is alert and oriented to person, place, and time. He has normal reflexes.  Skin: Skin is warm and dry. He has  a few scattered papules and one small area of scab on his fingers.  Psychiatric: He has a normal mood and affect. His behavior is normal. Judgment normal.  Vitals reviewed.  BP 119/79 mmHg  Pulse 62  Temp(Src) 98.1 F (36.7 C) (Oral)  Resp 16  Ht 5' 7.5" (1.715 m)  Wt 202 lb (91.627 kg)  BMI 31.15 kg/m2  SpO2 100% Wt Readings from Last 3 Encounters:  11/23/15 202 lb (91.627 kg)  08/20/15 216 lb 4.8 oz (98.113 kg)  05/10/15 208 lb (94.348 kg)      Assessment & Plan:  1. Annual physical exam - encouraged continued healthy eating habits and exercise, suggested he add stretching and yoga. Provided information about the Mediterranean Diet.    2. Need for Tdap vaccination - Tdap vaccine greater than or equal to 7yo IM  3. Hyperlipidemia - CBC with Differential/Platelet - Lipid panel - COMPLETE METABOLIC PANEL WITH GFR - Hemoglobin A1c  4. Screening for prostate cancer - PSA  5. Need for hepatitis C screening test - Hepatitis C antibody  6. Screening for HIV without presence of risk factors - HIV antibody  7. Rash and nonspecific skin eruption - betamethasone dipropionate (DIPROLENE) 0.05 % cream; Apply topically 2 (two) times daily. Apply sparingly for no more than 7 days  Dispense: 30 g; Refill: 0  - Follow up hyperlipidemia in 6 months if he is released from Dr. Gloris Ham, FNP-BC  [facility], [facility]  11/24/2015 8:05 AM

## 2015-11-24 ENCOUNTER — Encounter: Payer: Self-pay | Admitting: Family Medicine

## 2015-11-24 LAB — HEMOGLOBIN A1C
Hgb A1c MFr Bld: 5.4 % (ref <5.7)
MEAN PLASMA GLUCOSE: 108 mg/dL (ref <117)

## 2015-11-24 LAB — CBC WITH DIFFERENTIAL/PLATELET
BASOS PCT: 1 % (ref 0–1)
Basophils Absolute: 0.1 10*3/uL (ref 0.0–0.1)
EOS ABS: 0.5 10*3/uL (ref 0.0–0.7)
Eosinophils Relative: 6 % — ABNORMAL HIGH (ref 0–5)
HCT: 47.3 % (ref 39.0–52.0)
HEMOGLOBIN: 16.1 g/dL (ref 13.0–17.0)
Lymphocytes Relative: 22 % (ref 12–46)
Lymphs Abs: 1.7 10*3/uL (ref 0.7–4.0)
MCH: 31.8 pg (ref 26.0–34.0)
MCHC: 34 g/dL (ref 30.0–36.0)
MCV: 93.5 fL (ref 78.0–100.0)
MONO ABS: 0.7 10*3/uL (ref 0.1–1.0)
MONOS PCT: 9 % (ref 3–12)
MPV: 11.2 fL (ref 8.6–12.4)
Neutro Abs: 4.8 10*3/uL (ref 1.7–7.7)
Neutrophils Relative %: 62 % (ref 43–77)
PLATELETS: 230 10*3/uL (ref 150–400)
RBC: 5.06 MIL/uL (ref 4.22–5.81)
RDW: 13.3 % (ref 11.5–15.5)
WBC: 7.8 10*3/uL (ref 4.0–10.5)

## 2015-11-24 LAB — HEPATITIS C ANTIBODY: HCV Ab: NEGATIVE

## 2015-11-24 LAB — HIV ANTIBODY (ROUTINE TESTING W REFLEX): HIV 1&2 Ab, 4th Generation: NONREACTIVE

## 2015-11-24 LAB — PSA: PSA: 0.33 ng/mL (ref <=4.00)

## 2015-11-26 ENCOUNTER — Encounter: Payer: Self-pay | Admitting: Neurology

## 2015-11-26 ENCOUNTER — Ambulatory Visit (INDEPENDENT_AMBULATORY_CARE_PROVIDER_SITE_OTHER): Payer: BLUE CROSS/BLUE SHIELD | Admitting: Neurology

## 2015-11-26 VITALS — BP 120/74 | HR 66 | Ht 67.5 in | Wt 204.2 lb

## 2015-11-26 DIAGNOSIS — G458 Other transient cerebral ischemic attacks and related syndromes: Secondary | ICD-10-CM | POA: Diagnosis not present

## 2015-11-26 DIAGNOSIS — Z86718 Personal history of other venous thrombosis and embolism: Secondary | ICD-10-CM | POA: Diagnosis not present

## 2015-11-26 NOTE — Progress Notes (Signed)
NEUROLOGY FOLLOW UP OFFICE NOTE  [name] [name] [id number]  HISTORY OF PRESENT ILLNESS: [name] [name] is a 55 year old right-handed male with achalasia and history of DVT in left leg who follows up for TIA.  Recent history obtained also by last PCP note.  Labs and carotid doppler and echo report reviewed.  UPDATE: Carotid doppler showed no hemodynamically significant ICA stenosis.  2D echo with bubble study showed LV EF 60%-65% with negative bubble study, and hypercoagulable panel was unremarkable.  Fasting lipid panel revealed LDL of 142.  It was recommended to start Lipitor 40mg  daily.  Repeat fasting lipid panel from 11/23/15 showed LDL of 53.  Hgb A1c was 5.4%.  He has been do well.  He walks and has changed his diet.  He lost 14 lbs since last visit.  He would rather not remain on Lipitor since he has been changing his diet.  HISTORY: On 07/09/15, he was sitting in a chair about to watch a movie when he developed sudden-onset numbness of the left arm and hand.  He wasn't able to shake the sensation out.  He had difficulty moving his hand and arm.  Symptoms lasted about 45 minutes.  He did not have slurred speech, facial droop, dizziness, visual disturbance or headache.  He presented to Urgent Care and then the ED that day.  CT and MRI of the brain were unremarkable and did not show acute infarct.  CBC, CMP, urine drug screen, UA, and troponins were negative.  EKG was unremarkable.  He was advised to start aspirin 81mg  daily.  In July 2013, he developed a DVT in his left leg.  He was on Xarelto for 3 months.   There is no family history of stroke.  However, his paternal grandfather once had a "blood clot" in the leg.  His aunt had carotid artery disease.  PAST MEDICAL HISTORY: Past Medical History  Diagnosis Date  . DVT (deep venous thrombosis) (HCC) 05/2012    left leg  . Achalasia   . Chest pain   . Nonspecific ST-T wave electrocardiographic changes     GXT, EF 62%  . Limb pain    venous duplex, abonormal results, some edmea    MEDICATIONS: Current Outpatient Prescriptions on File Prior to Visit  Medication Sig Dispense Refill  . aspirin 81 MG tablet Take 81 mg by mouth daily.    Marland Kitchen. atorvastatin (LIPITOR) 40 MG tablet TAKE 1 TABLET(40 MG) BY MOUTH DAILY 30 tablet 3  . betamethasone dipropionate (DIPROLENE) 0.05 % cream Apply topically 2 (two) times daily. Apply sparingly for no more than 7 days 30 g 0   No current facility-administered medications on file prior to visit.    ALLERGIES: Allergies  Allergen Reactions  . Bee Venom Anaphylaxis    FAMILY HISTORY: Family History  Problem Relation Age of Onset  . Colon cancer Neg Hx   . Stomach cancer Neg Hx   . Esophageal cancer Neg Hx   . Rectal cancer Neg Hx     SOCIAL HISTORY: Social History   Social History  . Marital Status: Married    Spouse Name: N/A  . Number of Children: N/A  . Years of Education: N/A   Occupational History  . IT consultant    Social History Main Topics  . Smoking status: Never Smoker   . Smokeless tobacco: Never Used  . Alcohol Use: No  . Drug Use: No  . Sexual Activity: Not on file   Other Topics  Concern  . Not on file   Social History Narrative    REVIEW OF SYSTEMS: Constitutional: No fevers, chills, or sweats, no generalized fatigue, change in appetite Eyes: No visual changes, double vision, eye pain Ear, nose and throat: No hearing loss, ear pain, nasal congestion, sore throat Cardiovascular: No chest pain, palpitations Respiratory:  No shortness of breath at rest or with exertion, wheezes GastrointestinaI: No nausea, vomiting, diarrhea, abdominal pain, fecal incontinence Genitourinary:  No dysuria, urinary retention or frequency Musculoskeletal:  No neck pain, back pain Integumentary: No rash, pruritus, skin lesions Neurological: as above Psychiatric: No depression, insomnia, anxiety Endocrine: No palpitations, fatigue, diaphoresis, mood swings, change in  appetite, change in weight, increased thirst Hematologic/Lymphatic:  No anemia, purpura, petechiae. Allergic/Immunologic: no itchy/runny eyes, nasal congestion, recent allergic reactions, rashes  PHYSICAL EXAM: Filed Vitals:   11/26/15 0739  BP: 120/74  Pulse: 66   General: No acute distress.  Patient appears well-groomed.   Head:  Normocephalic/atraumatic Eyes:  Fundoscopic exam unremarkable without vessel changes, exudates, hemorrhages or papilledema. Neck: supple, no paraspinal tenderness, full range of motion Heart:  Regular rate and rhythm Lungs:  Clear to auscultation bilaterally Back: No paraspinal tenderness Neurological Exam: alert and oriented to person, place, and time. Attention span and concentration intact, recent and remote memory intact, fund of knowledge intact.  Speech fluent and not dysarthric, language intact.  CN II-XII intact. Fundoscopic exam unremarkable without vessel changes, exudates, hemorrhages or papilledema.  Bulk and tone normal, muscle strength 5/5 throughout.  Sensation to light touch, temperature and vibration intact.  Deep tendon reflexes 2+ throughout, toes downgoing.  Finger to nose and heel to shin testing intact.  Gait normal.  IMPRESSION: TIA History of DVT  PLAN: 1.  Continue asa 81mg  daily 2.  He will always need to be on some statin therapy indefinitely, even if LDL looks good.  LDL goal should be less than 100, which it currently is.  This may continue to be managed and adjusted by his PCP 3.  Mediterranean diet and regular cardiovascular exercise 4.  Follow up as needed.  Shon Millet, DO  CC:  Olean Ree, FNP

## 2015-11-26 NOTE — Patient Instructions (Signed)
You are doing great! You will need to remain on aspirin 81mg  daily You will need to remain on some dose of statin therapy.  LDL goal is less than 100.  Your new LDL is 53.  You may follow up with your PCP regarding any adjustments needed, but the goal LDL should be less than 100. Continue routine exercise Mediterranean diet Follow up as needed.

## 2015-12-13 ENCOUNTER — Ambulatory Visit: Payer: BLUE CROSS/BLUE SHIELD | Admitting: Neurology

## 2016-03-16 ENCOUNTER — Other Ambulatory Visit: Payer: Self-pay | Admitting: Neurology

## 2016-05-23 ENCOUNTER — Ambulatory Visit: Payer: BLUE CROSS/BLUE SHIELD | Admitting: Family Medicine

## 2016-05-30 ENCOUNTER — Ambulatory Visit (INDEPENDENT_AMBULATORY_CARE_PROVIDER_SITE_OTHER): Payer: BLUE CROSS/BLUE SHIELD | Admitting: Physician Assistant

## 2016-05-30 ENCOUNTER — Encounter: Payer: Self-pay | Admitting: Physician Assistant

## 2016-05-30 VITALS — BP 126/70 | HR 65 | Temp 97.8°F | Resp 18 | Ht 67.5 in

## 2016-05-30 DIAGNOSIS — G459 Transient cerebral ischemic attack, unspecified: Secondary | ICD-10-CM | POA: Diagnosis not present

## 2016-05-30 DIAGNOSIS — L301 Dyshidrosis [pompholyx]: Secondary | ICD-10-CM | POA: Insufficient documentation

## 2016-05-30 LAB — CBC WITH DIFFERENTIAL/PLATELET
BASOS ABS: 75 {cells}/uL (ref 0–200)
Basophils Relative: 1 %
EOS ABS: 525 {cells}/uL — AB (ref 15–500)
Eosinophils Relative: 7 %
HEMATOCRIT: 45 % (ref 38.5–50.0)
HEMOGLOBIN: 15.7 g/dL (ref 13.2–17.1)
LYMPHS ABS: 1725 {cells}/uL (ref 850–3900)
LYMPHS PCT: 23 %
MCH: 31.3 pg (ref 27.0–33.0)
MCHC: 34.9 g/dL (ref 32.0–36.0)
MCV: 89.8 fL (ref 80.0–100.0)
MONO ABS: 750 {cells}/uL (ref 200–950)
MPV: 10.3 fL (ref 7.5–12.5)
Monocytes Relative: 10 %
NEUTROS PCT: 59 %
Neutro Abs: 4425 cells/uL (ref 1500–7800)
Platelets: 193 10*3/uL (ref 140–400)
RBC: 5.01 MIL/uL (ref 4.20–5.80)
RDW: 13.5 % (ref 11.0–15.0)
WBC: 7.5 10*3/uL (ref 3.8–10.8)

## 2016-05-30 NOTE — Patient Instructions (Signed)
     IF you received an x-ray today, you will receive an invoice from [facility]. Please contact [facility] at [phone] with questions or concerns regarding your invoice.   IF you received labwork today, you will receive an invoice from [facility]. Please contact [facility] at [phone] with questions or concerns regarding your invoice.   Our billing staff will not be able to assist you with questions regarding bills from these companies.  You will be contacted with the lab results as soon as they are available. The fastest way to get your results is to activate your My Chart account. Instructions are located on the last page of this paperwork. If you have not heard from us regarding the results in 2 weeks, please contact this office.      

## 2016-05-30 NOTE — Progress Notes (Signed)
   Subjective:    Patient ID: [name] [name], male    DOB: [birth year], 55 y.o.   MRN: [id number]  HPI  [name] [name] presents today for f/u on cholesterol. History of TIA about 1 year ago and history of DVT about 7 years ago. Taking 81 mg aspirin daily and was taking 40 mg atorvastatin daily, but ran out of atorvastatin earlier this month. The patient would really prefer to stop taking the atorvastatin if LDL is within goal, he does not want to be on a lot of chronic medications and discussed this with Deboraha Sprangebbie Gessner FNP previously.  He is trying to eat better and attempts 10,000 steps per day. He has had some trouble losing weight, but plans to get back on track with diet and exercise. Denies any chest pain, palpitations, leg swelling or claudication, abdominal pain, headaches, numbness or tingling.    Current outpatient prescriptions:  .  aspirin 81 MG tablet, Take 81 mg by mouth daily., Disp: , Rfl:  .  betamethasone dipropionate (DIPROLENE) 0.05 % cream, Apply topically 2 (two) times daily. Apply sparingly for no more than 7 days (Patient not taking: Reported on 05/30/2016), Disp: 30 g, Rfl: 0   Allergies  Allergen Reactions  . Bee Venom Anaphylaxis    Review of Systems  All other systems reviewed and are negative.      Objective:   Physical Exam  Constitutional: He is oriented to person, place, and time. Vital signs are normal. He appears well-nourished. He is cooperative. No distress.  Blood pressure 126/70, pulse 65, temperature 97.8 F (36.6 C), temperature source Oral, resp. rate 18, height 5' 7.5" (1.715 m), SpO2 98 %.  HENT:  Head: Normocephalic and atraumatic.  Neck: Trachea normal and normal range of motion. Neck supple.  Cardiovascular: Normal rate, regular rhythm, normal heart sounds and intact distal pulses.   Pulmonary/Chest: Effort normal and breath sounds normal.  Musculoskeletal: Normal range of motion.  Lymphadenopathy:    He has no cervical adenopathy.  Neurological:  He is alert and oriented to person, place, and time. He has normal strength.  Skin: Skin is warm, dry and intact.  Psychiatric: He has a normal mood and affect. His speech is normal and behavior is normal. Judgment and thought content normal. Cognition and memory are normal.        Assessment & Plan:  1. Hx of TIA - lipid panel - CBC w/ diff - CMET - stopped atorvastatin for now. May restart but at 20 mg PO daily if LDL > 100 mg/dL - continue 81 mg ASA daily  Kelly Rayburn PA-S 05/30/2016

## 2016-05-30 NOTE — Progress Notes (Signed)
Patient ID: [name] [name], male    DOB: March 03, 1961, 55 y.o.   MRN: [id number]  PCP: Nestor Ramp  Subjective:   Chief Complaint  Patient presents with  . Follow-up    6 MONTH FROM DEBBIE/LABS    HPI Presents for evaluation of lipids, s/p TIA 06/2015.  Last visit with neurology was 11/2015. He's done very well, without recurrent weakness or other ischemic attack symptoms. Has been working hard on healthier eating until recently when he reverted back to some old habits. Both his parents have hyperlipidemia, but there is no family history of stroke. He ran out of his statin about a month ago, and the plan was to check his lipids off it to see if his lifestyle changes had been adequate. He understands that there is benefit to being on a statin, despite normal lipids, in prevention of another event.    Review of Systems No chest pain, SOB, HA, dizziness, vision change, N/V, diarrhea, constipation, dysuria, urinary urgency or frequency, myalgias, arthralgias or rash.     Patient Active Problem List   Diagnosis Date Noted  . Eczema, dyshidrotic 05/30/2016  . History of DVT (deep vein thrombosis) 08/20/2015  . Numbness and tingling in left hand 07/10/2015  . TIA (transient ischemic attack)   . Anaphylaxis 08/22/2013     Prior to Admission medications   Medication Sig Start Date End Date Taking? Authorizing Provider  aspirin 81 MG tablet Take 81 mg by mouth daily.   Yes Historical Provider, MD  betamethasone dipropionate (DIPROLENE) 0.05 % cream Apply topically 2 (two) times daily. Apply sparingly for no more than 7 days Patient not taking: Reported on 05/30/2016 11/23/15   Emi Belfast, FNP     Allergies  Allergen Reactions  . Bee Venom Anaphylaxis       Objective:  Physical Exam  Constitutional: He is oriented to person, place, and time. He appears well-developed and well-nourished. He is active and cooperative. No distress.  BP 126/70 mmHg   Pulse 65  Temp(Src) 97.8 F (36.6 C) (Oral)  Resp 18  Ht 5' 7.5" (1.715 m)  SpO2 98%  HENT:  Head: Normocephalic and atraumatic.  Right Ear: Hearing normal.  Left Ear: Hearing normal.  Eyes: Conjunctivae are normal. No scleral icterus.  Neck: Normal range of motion. Neck supple. No thyromegaly present.  Cardiovascular: Normal rate, regular rhythm and normal heart sounds.   Pulses:      Radial pulses are 2+ on the right side, and 2+ on the left side.  Pulmonary/Chest: Effort normal and breath sounds normal.  Lymphadenopathy:       Head (right side): No tonsillar, no preauricular, no posterior auricular and no occipital adenopathy present.       Head (left side): No tonsillar, no preauricular, no posterior auricular and no occipital adenopathy present.    He has no cervical adenopathy.       Right: No supraclavicular adenopathy present.       Left: No supraclavicular adenopathy present.  Neurological: He is alert and oriented to person, place, and time. No sensory deficit.  Skin: Skin is warm, dry and intact. No rash noted. No cyanosis or erythema. Nails show no clubbing.  Psychiatric: He has a normal mood and affect. His speech is normal and behavior is normal.           Assessment & Plan:   1. Transient cerebral ischemia, unspecified transient cerebral ischemia type No recurrent symptoms. Await labs. If LDL  is >100, will start back on statin, perhaps at 20 mg instead of 40.  - Lipid panel - Comprehensive metabolic panel - CBC with Differential/Platelet   Return in about 3 months (around 08/30/2016).    Fernande Brashelle S. Tamica Covell, PA-C Physician Assistant-Certified [facility]

## 2016-05-31 ENCOUNTER — Encounter: Payer: Self-pay | Admitting: Physician Assistant

## 2016-05-31 LAB — COMPREHENSIVE METABOLIC PANEL
ALT: 20 U/L (ref 9–46)
AST: 17 U/L (ref 10–35)
Albumin: 4.6 g/dL (ref 3.6–5.1)
Alkaline Phosphatase: 88 U/L (ref 40–115)
BUN: 16 mg/dL (ref 7–25)
CALCIUM: 9.5 mg/dL (ref 8.6–10.3)
CHLORIDE: 104 mmol/L (ref 98–110)
CO2: 26 mmol/L (ref 20–31)
CREATININE: 1.12 mg/dL (ref 0.70–1.33)
GLUCOSE: 90 mg/dL (ref 65–99)
POTASSIUM: 4.7 mmol/L (ref 3.5–5.3)
Sodium: 139 mmol/L (ref 135–146)
Total Bilirubin: 0.5 mg/dL (ref 0.2–1.2)
Total Protein: 7 g/dL (ref 6.1–8.1)

## 2016-05-31 LAB — LIPID PANEL
CHOL/HDL RATIO: 3.1 ratio (ref <=5.0)
CHOLESTEROL: 210 mg/dL — AB (ref 125–200)
HDL: 67 mg/dL (ref >=40)
LDL Cholesterol: 110 mg/dL (ref <130)
TRIGLYCERIDES: 166 mg/dL — AB (ref <150)
VLDL: 33 mg/dL — AB (ref <30)

## 2016-05-31 MED ORDER — ATORVASTATIN CALCIUM 20 MG PO TABS: 20.0000 mg | ORAL_TABLET | Freq: Every day | ORAL | Status: DC

## 2016-05-31 NOTE — Addendum Note (Signed)
Addended by: Fernande BrasJEFFERY, Michaeline Eckersley S on: 05/31/2016 02:09 PM   Modules accepted: Orders

## 2016-09-23 IMAGING — CT CT HEAD W/O CM
2 series · 15 of 30 positions shown, 19 images · non-contrast
Comparison: None.

CLINICAL DATA: Initial valuation for acute left upper extremity
numbness.

EXAM:
CT HEAD WITHOUT CONTRAST
TECHNIQUE: Contiguous axial images were obtained from the base of the skull
through the vertex without intravenous contrast.

[Series 201: head w/o, idose (1) · axial · non-contrast · 0.47mm/px · z∈[+70,+200]mm · 13 of 32 slices shown, 17 images]
[im 3/32  brain]
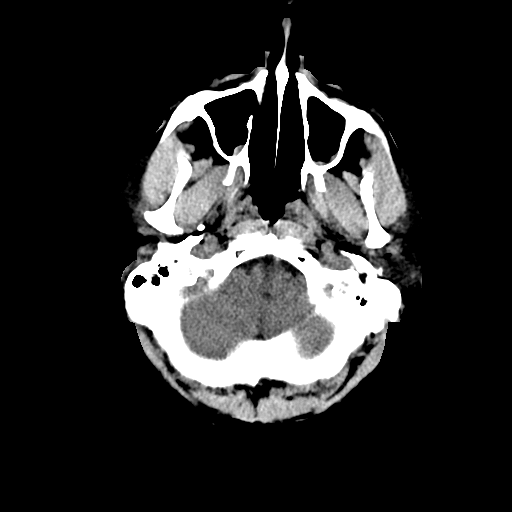
[im 3/32  bone]
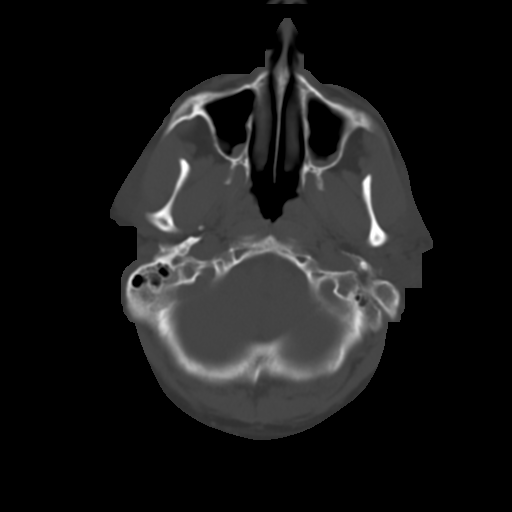
[im 5/32  brain]
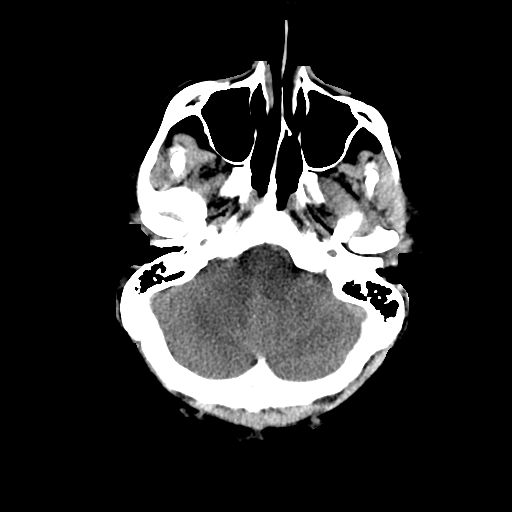
[im 7/32  brain]
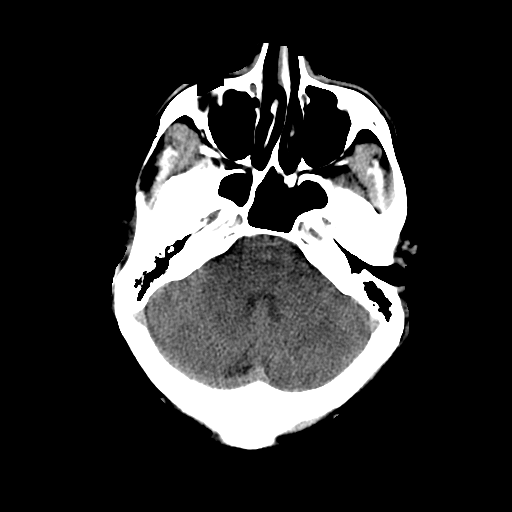
[im 9/32  brain]
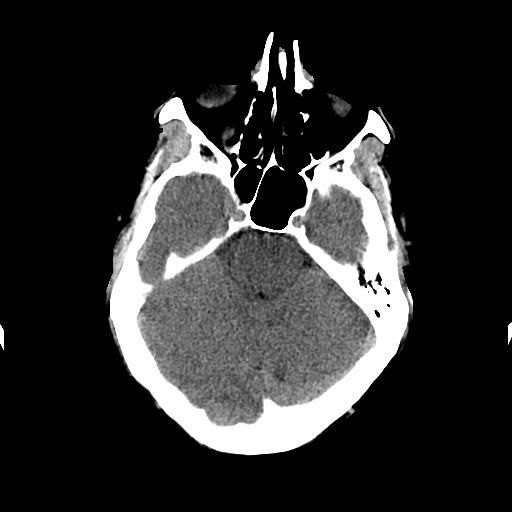
[im 12/32  brain]
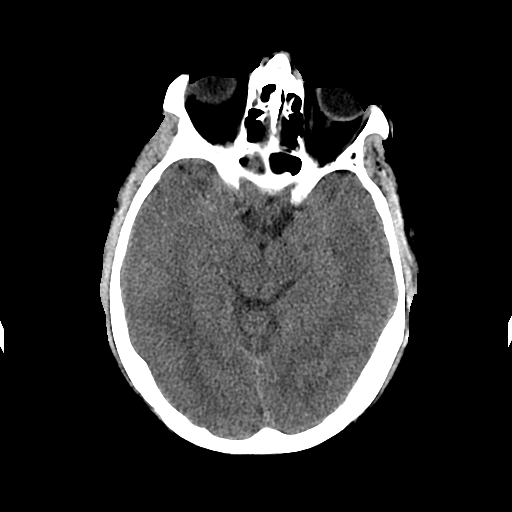
[im 12/32  bone]
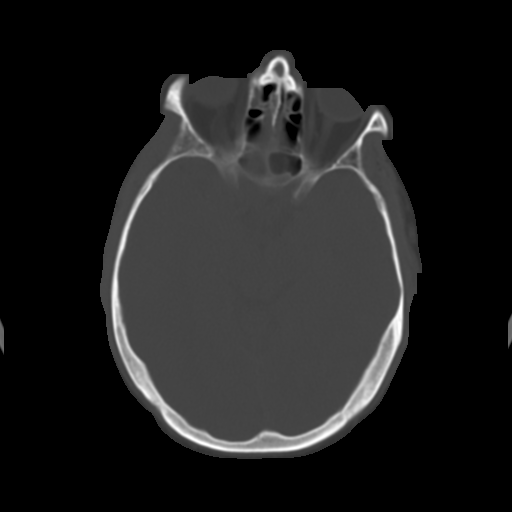
[im 14/32  brain]
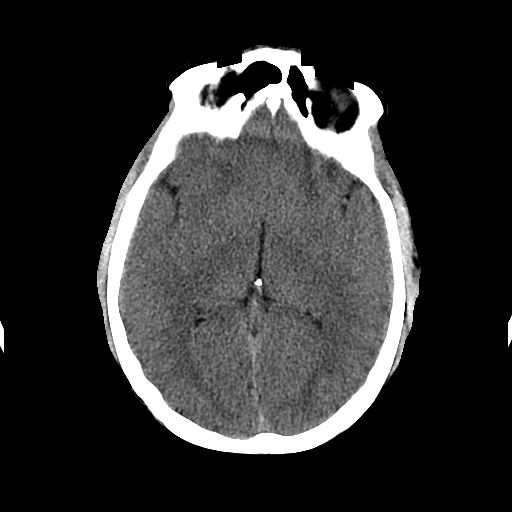
[im 16/32  brain]
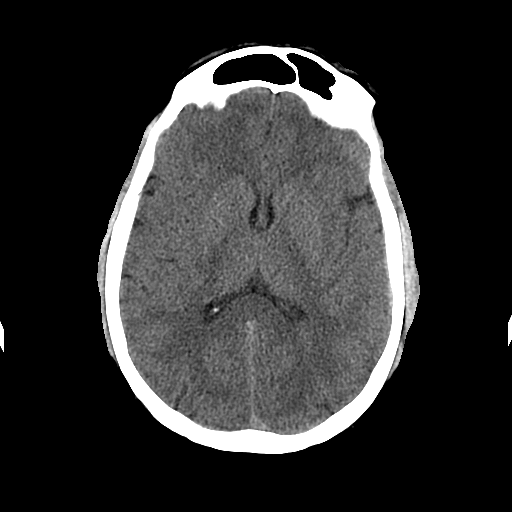
[im 18/32  brain]
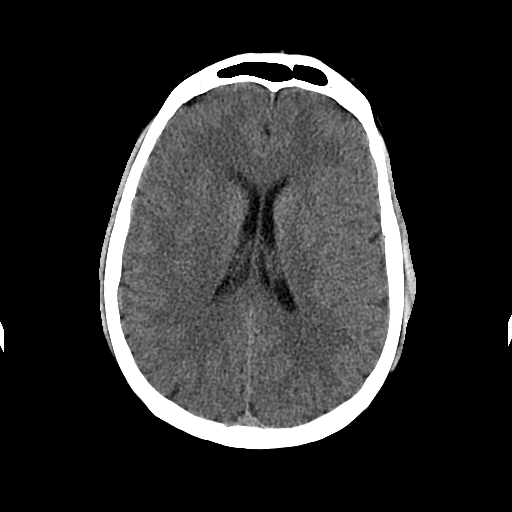
[im 20/32  brain]
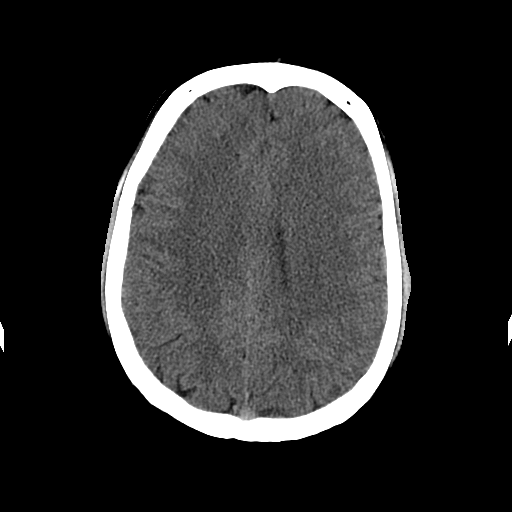
[im 20/32  bone]
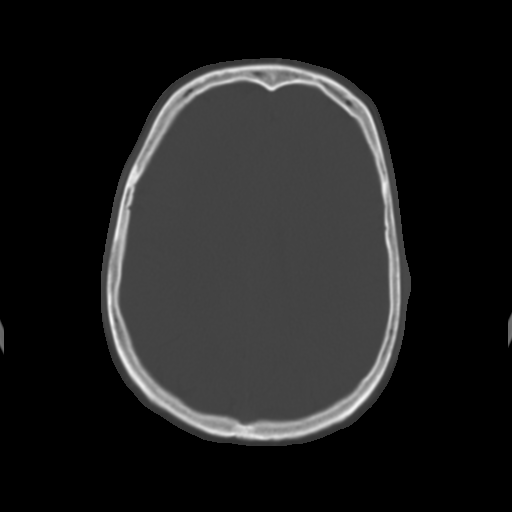
[im 23/32  brain]
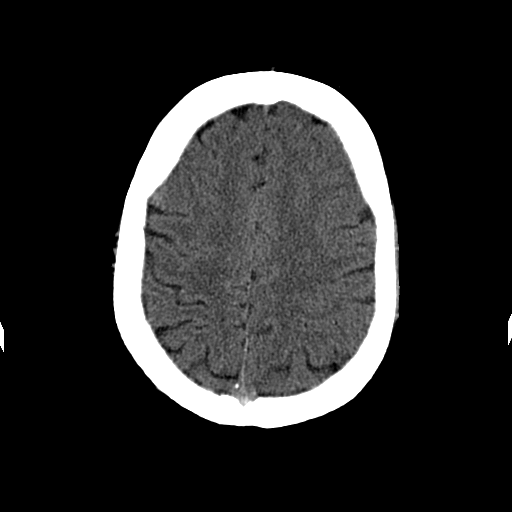
[im 25/32  brain]
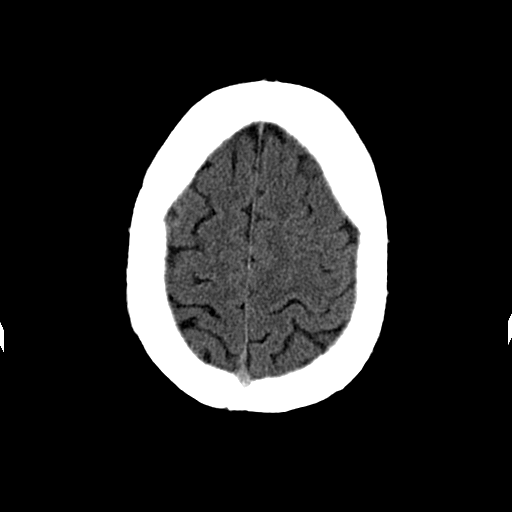
[im 27/32  brain]
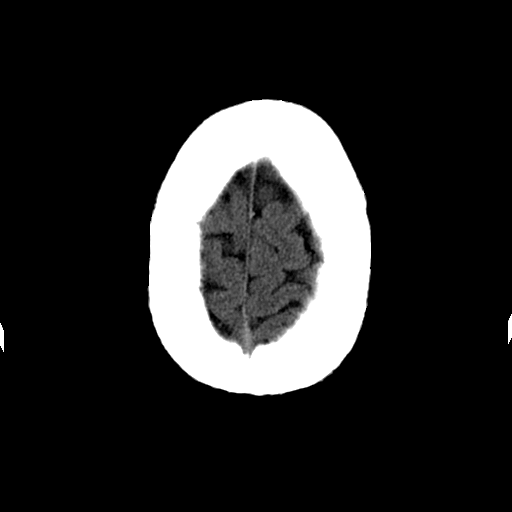
[im 29/32  brain]
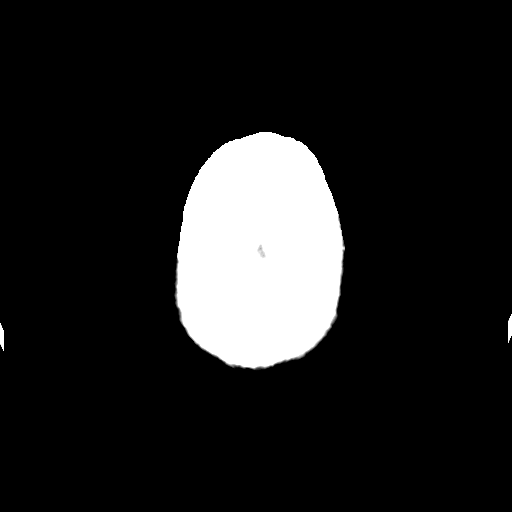
[im 29/32  bone]
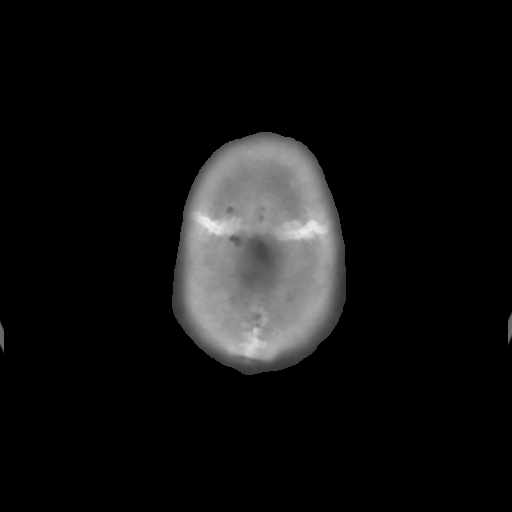

[Series 202: head w/o bone, idose (1) · axial · non-contrast · 0.47mm/px · z∈[+70,+90]mm · 2 of 32 slices shown]
[im 3/32  bone]
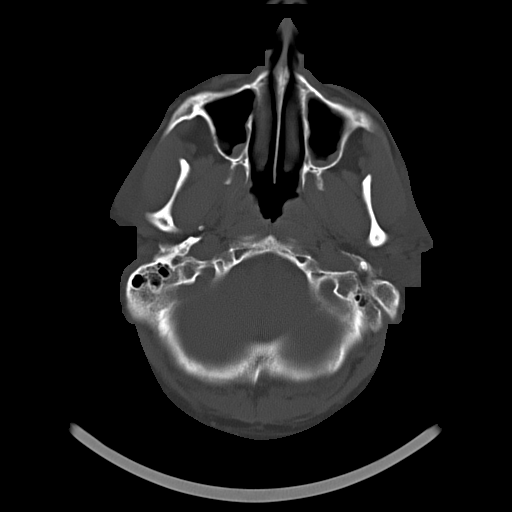
[im 7/32  bone]
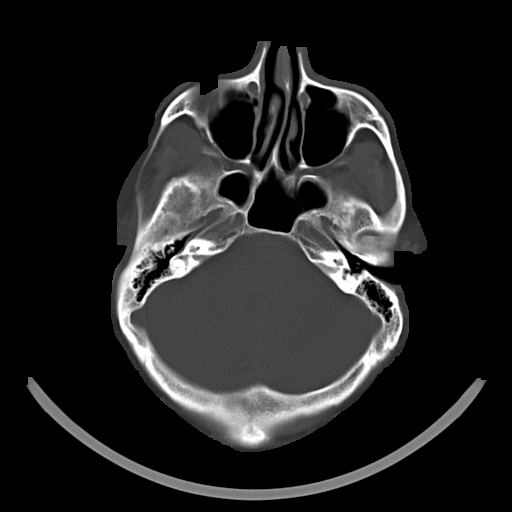

[15 of 30 positions shown; findings below may reference images not displayed]

FINDINGS: There is no acute intracranial hemorrhage or infarct. No mass lesion
or midline shift. Gray-white matter differentiation is well
maintained. Ventricles are normal in size without evidence of
hydrocephalus. CSF containing spaces are within normal limits. No
extra-axial fluid collection. Chiari malformation noted with the
cerebellar tonsils low lying in the foramen magnum.

The calvarium is intact.

Orbital soft tissues are within normal limits.

Minimal polypoid opacity within the maxillary sinuses. Minimal
scattered mucosal thickening within the ethmoidal air cells.
Paranasal sinuses are otherwise clear. No mastoid effusion.

Scalp soft tissues are unremarkable.
IMPRESSION: 1. No acute intracranial process.
2. Chiari 1 malformation.

## 2018-02-18 ENCOUNTER — Encounter: Payer: Self-pay | Admitting: Physician Assistant

## 2019-01-10 ENCOUNTER — Other Ambulatory Visit: Payer: Self-pay | Admitting: Family Medicine

## 2019-01-10 DIAGNOSIS — R1032 Left lower quadrant pain: Secondary | ICD-10-CM

## 2020-02-21 ENCOUNTER — Ambulatory Visit: Payer: BLUE CROSS/BLUE SHIELD | Attending: Internal Medicine

## 2020-02-21 DIAGNOSIS — Z23 Encounter for immunization: Secondary | ICD-10-CM

## 2020-02-21 NOTE — Progress Notes (Signed)
   Covid-19 Vaccination Clinic  Name:  [name] [name]    MRN: [id number] DOB: 1961-06-26  02/21/2020  Mr. Ungaro was observed post Covid-19 immunization for 30 minutes based on pre-vaccination screening without incident. He was provided with Vaccine Information Sheet and instruction to access the V-Safe system.   Mr. Buntyn was instructed to call 911 with any severe reactions post vaccine: Marland Kitchen Difficulty breathing  . Swelling of face and throat  . A fast heartbeat  . A bad rash all over body  . Dizziness and weakness   Immunizations Administered    Name Date Dose VIS Date Route   Pfizer COVID-19 Vaccine 02/21/2020  2:55 PM 0.3 mL 10/24/2019 Intramuscular   Manufacturer: [employer], [employer]   Lot: [id number]   NDC: [address]-8

## 2020-03-16 ENCOUNTER — Ambulatory Visit: Payer: BLUE CROSS/BLUE SHIELD | Attending: Internal Medicine

## 2020-03-16 DIAGNOSIS — Z23 Encounter for immunization: Secondary | ICD-10-CM

## 2020-03-16 NOTE — Progress Notes (Signed)
   Covid-19 Vaccination Clinic  Name:  [name] [name]    MRN: [id number] DOB: 03-13-1961  03/16/2020  [name] [name] was observed post Covid-19 immunization for 30 minutes based on pre-vaccination screening without incident. He was provided with Vaccine Information Sheet and instruction to access the V-Safe system.   [name] [name] was instructed to call 911 with any severe reactions post vaccine: Marland Kitchen Difficulty breathing  . Swelling of face and throat  . A fast heartbeat  . A bad rash all over body  . Dizziness and weakness   Immunizations Administered    Name Date Dose VIS Date Route   Pfizer COVID-19 Vaccine 03/16/2020  4:26 PM 0.3 mL 01/07/2019 Intramuscular   Manufacturer: [employer], [employer]   Lot: [id number]   NDC: [address]-3

## 2022-11-08 ENCOUNTER — Encounter: Payer: Self-pay | Admitting: Internal Medicine

## 2023-01-29 ENCOUNTER — Encounter: Payer: Self-pay | Admitting: Internal Medicine

## 2023-02-20 ENCOUNTER — Ambulatory Visit (AMBULATORY_SURGERY_CENTER): Payer: BLUE CROSS/BLUE SHIELD | Admitting: *Deleted

## 2023-02-20 VITALS — Ht 68.0 in | Wt 197.0 lb

## 2023-02-20 DIAGNOSIS — Z1211 Encounter for screening for malignant neoplasm of colon: Secondary | ICD-10-CM

## 2023-02-20 MED ORDER — NA SULFATE-K SULFATE-MG SULF 17.5-3.13-1.6 GM/177ML PO SOLN: 1.0000 | Freq: Once | ORAL | 0 refills | Status: AC

## 2023-02-20 NOTE — Progress Notes (Signed)
Pt's pre-visit is done over the phone and all paperwork (prep instructions) sent to patient. Pt's name and DOB verified at the beginning of the pre-visit. Pt denies any difficulty with ambulating.  No egg or soy allergy known to patient  No issues known to pt with past sedation with any surgeries or procedures Pt denies having issues being intubated Pt has no issues moving head neck No FH of Malignant Hyperthermia Pt is not on diet pills Pt is not on home 02  Pt is not on blood thinners  Pt denies issues with constipation  Pt is not on dialysis Pt denies any upcoming cardiac testing Pt encouraged to use to use Singlecare or Goodrx to reduce cost  Patient's chart reviewed by Cathlyn Parsons CNRA prior to pre-visit and patient appropriate for the [facility].  Pre-visit completed and red dot placed by patient's name on their procedure day (on provider's schedule).  . Visit by phone Pt states  weight is 197 lb  Instructions reviewed with pt and pt states understanding. Instructed to review again prior to procedure. Pt states they will.  Instructions sent by mail with coupon  Hx of swallowing issue in past had Esophagogastric Fundoplication and Heller Myotomy and he states swallowing is no issue now.

## 2023-03-08 ENCOUNTER — Encounter: Payer: Self-pay | Admitting: Internal Medicine

## 2023-03-15 ENCOUNTER — Ambulatory Visit: Payer: BC Managed Care – PPO | Admitting: Internal Medicine

## 2023-03-15 ENCOUNTER — Encounter: Payer: Self-pay | Admitting: Internal Medicine

## 2023-03-15 VITALS — BP 108/67 | HR 48 | Temp 97.8°F | Resp 10 | Ht 68.0 in | Wt 197.0 lb

## 2023-03-15 DIAGNOSIS — D123 Benign neoplasm of transverse colon: Secondary | ICD-10-CM

## 2023-03-15 DIAGNOSIS — Z1211 Encounter for screening for malignant neoplasm of colon: Secondary | ICD-10-CM

## 2023-03-15 DIAGNOSIS — Z86010 Personal history of colonic polyps: Secondary | ICD-10-CM | POA: Insufficient documentation

## 2023-03-15 MED ORDER — SODIUM CHLORIDE 0.9 % IV SOLN: 500.0000 mL | Freq: Once | INTRAVENOUS | Status: DC

## 2023-03-15 NOTE — Progress Notes (Signed)
Vss nad trans to pacu 

## 2023-03-15 NOTE — Progress Notes (Signed)
Pt's states no medical or surgical changes since previsit or office visit. 

## 2023-03-15 NOTE — Progress Notes (Signed)
Called to room to assist during endoscopic procedure.  Patient ID and intended procedure confirmed with present staff. Received instructions for my participation in the procedure from the performing physician.  

## 2023-03-15 NOTE — Op Note (Signed)
[facility] Patient Name: [name] [name] Procedure Date: 03/15/2023 8:29 AM MRN: [id number] Endoscopist: Iva Boop , MD, [id number] Age: 62 Referring MD:  Date of Birth: 23-Apr-1961 Gender: Male Account #: [id number] Procedure:                Colonoscopy Indications:              Screening for colorectal malignant neoplasm, Last                            colonoscopy: 2013 Medicines:                Monitored Anesthesia Care Procedure:                Pre-Anesthesia Assessment:                           - Prior to the procedure, a History and Physical                            was performed, and patient medications and                            allergies were reviewed. The patient's tolerance of                            previous anesthesia was also reviewed. The risks                            and benefits of the procedure and the sedation                            options and risks were discussed with the patient.                            All questions were answered, and informed consent                            was obtained. Prior Anticoagulants: The patient has                            taken no anticoagulant or antiplatelet agents. ASA                            Grade Assessment: II - A patient with mild systemic                            disease. After reviewing the risks and benefits,                            the patient was deemed in satisfactory condition to                            undergo the procedure.  After obtaining informed consent, the colonoscope                            was passed under direct vision. Throughout the                            procedure, the patient's blood pressure, pulse, and                            oxygen saturations were monitored continuously. The                            Olympus CF-HQ190L [id number]) Colonoscope was                            introduced through the anus and advanced to the  the                            cecum, identified by appendiceal orifice and                            ileocecal valve. The colonoscopy was performed                            without difficulty. The patient tolerated the                            procedure well. The quality of the bowel                            preparation was excellent. The ileocecal valve,                            appendiceal orifice, and rectum were photographed.                            The bowel preparation used was SUPREP via split                            dose instruction. Scope In: 8:37:17 AM Scope Out: 8:50:54 AM Scope Withdrawal Time: 0 hours 10 minutes 15 seconds  Total Procedure Duration: 0 hours 13 minutes 37 seconds  Findings:                 The perianal and digital rectal examinations were                            normal. Pertinent negatives include normal prostate                            (size, shape, and consistency).                           A 1 mm polyp was found in the transverse colon. The  polyp was sessile. The polyp was removed with a                            cold biopsy forceps. Resection and retrieval were                            complete. Verification of patient identification                            for the specimen was done. Estimated blood loss was                            minimal.                           Multiple diverticula were found in the sigmoid                            colon.                           The exam was otherwise without abnormality on                            direct and retroflexion views. Complications:            No immediate complications. Estimated Blood Loss:     Estimated blood loss was minimal. Impression:               - One 1 mm polyp in the transverse colon, removed                            with a cold biopsy forceps. Resected and retrieved.                           - Diverticulosis in the sigmoid  colon.                           - The examination was otherwise normal on direct                            and retroflexion views. Recommendation:           - Patient has a contact number available for                            emergencies. The signs and symptoms of potential                            delayed complications were discussed with the                            patient. Return to normal activities tomorrow.                            Written discharge instructions were provided to  the                            patient.                           - Resume previous diet.                           - Continue present medications.                           - Repeat colonoscopy is recommended. The                            colonoscopy date will be determined after pathology                            results from today's exam become available for                            review. Iva Boop, MD 03/15/2023 8:59:38 AM This report has been signed electronically.

## 2023-03-15 NOTE — Progress Notes (Signed)
[facility] History and Physical   Primary Care Physician:  Dois Davenport, MD   Reason for Procedure:   Colon cancer screening  Plan:    colonoscopy     HPI: [name] [name] is a 62 y.o. male here for screening, s/p negative exam 10/2012   Past Medical History:  Diagnosis Date   Achalasia    Allergy    Chest pain    DVT (deep venous thrombosis) (HCC) 05/13/2012   left leg   Limb pain    venous duplex, abonormal results, some edmea   Nonspecific ST-T wave electrocardiographic changes    GXT, EF 62%    Past Surgical History:  Procedure Laterality Date   CHOLECYSTECTOMY     ESOPHAGOGASTRIC FUNDOPLICATION  2002   gallbladder      GASTRIC FUNDOPLICATION  11/13/2000   Toupet   HELLER MYOTOMY  11/13/2000    Prior to Admission medications   Medication Sig Start Date End Date Taking? Authorizing Provider  aspirin 81 MG tablet Take 81 mg by mouth daily.   Yes [provider]  Vitamin D, Ergocalciferol, (DRISDOL) 1.25 MG (50000 UNIT) CAPS capsule  08/10/22  Yes [provider]  betamethasone dipropionate (DIPROLENE) 0.05 % cream Apply topically 2 (two) times daily. Apply sparingly for no more than 7 days 11/23/15   Emi Belfast, FNP  WEGOVY 1.7 MG/0.75ML SOAJ Inject into the skin. Patient not taking: Reported on 02/20/2023 09/25/22   [provider]    Current Outpatient Medications  Medication Sig Dispense Refill   aspirin 81 MG tablet Take 81 mg by mouth daily.     Vitamin D, Ergocalciferol, (DRISDOL) 1.25 MG (50000 UNIT) CAPS capsule      betamethasone dipropionate (DIPROLENE) 0.05 % cream Apply topically 2 (two) times daily. Apply sparingly for no more than 7 days 30 g 0   WEGOVY 1.7 MG/0.75ML SOAJ Inject into the skin. (Patient not taking: Reported on 02/20/2023)     Current Facility-Administered Medications  Medication Dose Route Frequency Provider Last Rate Last Admin   0.9 %  sodium chloride infusion  500 mL Intravenous  Once Iva Boop, MD        Allergies as of 03/15/2023 - Review Complete 03/15/2023  Allergen Reaction Noted   Bee venom Anaphylaxis 05/14/2012    Family History  Problem Relation Age of Onset   Colon cancer Neg Hx    Stomach cancer Neg Hx    Esophageal cancer Neg Hx    Rectal cancer Neg Hx    Colon polyps Neg Hx     Social History   Socioeconomic History   Marital status: Married    Spouse name: Not on file   Number of children: Not on file   Years of education: Not on file   Highest education level: Not on file  Occupational History   Occupation: Psychologist, educational  Tobacco Use   Smoking status: Never   Smokeless tobacco: Never  Substance and Sexual Activity   Alcohol use: No   Drug use: No   Sexual activity: Not on file  Other Topics Concern   Not on file  Social History Narrative   Not on file   Social Determinants of Health   Financial Resource Strain: Not on file  Food Insecurity: Not on file  Transportation Needs: Not on file  Physical Activity: Not on file  Stress: Not on file  Social Connections: Not on file  Intimate Partner Violence: Not on file  Review of Systems:  All other review of systems negative except as mentioned in the HPI.  Physical Exam: Vital signs BP (!) 103/55   Pulse (!) 54   Temp 97.8 F (36.6 C)   Ht 5\' 8"  (1.727 m)   Wt 197 lb (89.4 kg)   SpO2 97%   BMI 29.95 kg/m   General:   Alert,  Well-developed, well-nourished, pleasant and cooperative in NAD Lungs:  Clear throughout to auscultation.   Heart:  Regular rate and rhythm; no murmurs, clicks, rubs,  or gallops. Abdomen:  Soft, nontender and nondistended. Normal bowel sounds.   Neuro/Psych:  Alert and cooperative. Normal mood and affect. A and O x 3   @Jaimee Corum  Sena Slate, MD, [facility] [phone] (pager) 03/15/2023 8:28 AM@

## 2023-03-15 NOTE — Patient Instructions (Addendum)
I found and removed a 1 mm polyp. I will let you know pathology results and when to have another routine colonoscopy by mail and/or My Chart.  You still have diverticulosis - thickened muscle rings and pouches in the colon wall. Please read the handout about this condition.   I appreciate the opportunity to care for you. Iva Boop, MD, FACG  YOU HAD AN ENDOSCOPIC PROCEDURE TODAY AT THE [facility]:   Refer to the procedure report that was given to you for any specific questions about what was found during the examination.  If the procedure report does not answer your questions, please call your gastroenterologist to clarify.  If you requested that your care partner not be given the details of your procedure findings, then the procedure report has been included in a sealed envelope for you to review at your convenience later.  YOU SHOULD EXPECT: Some feelings of bloating in the abdomen. Passage of more gas than usual.  Walking can help get rid of the air that was put into your GI tract during the procedure and reduce the bloating. If you had a lower endoscopy (such as a colonoscopy or flexible sigmoidoscopy) you may notice spotting of blood in your stool or on the toilet paper. If you underwent a bowel prep for your procedure, you may not have a normal bowel movement for a few days.  Please Note:  You might notice some irritation and congestion in your nose or some drainage.  This is from the oxygen used during your procedure.  There is no need for concern and it should clear up in a day or so.  SYMPTOMS TO REPORT IMMEDIATELY:  Following lower endoscopy (colonoscopy or flexible sigmoidoscopy):  Excessive amounts of blood in the stool  Significant tenderness or worsening of abdominal pains  Swelling of the abdomen that is new, acute  Fever of 100F or higher   For urgent or emergent issues, a gastroenterologist can be reached at any hour by calling [phone]. Do not use  MyChart messaging for urgent concerns.    DIET:  We do recommend a small meal at first, but then you may proceed to your regular diet.  Drink plenty of fluids but you should avoid alcoholic beverages for 24 hours.  ACTIVITY:  You should plan to take it easy for the rest of today and you should NOT DRIVE or use heavy machinery until tomorrow (because of the sedation medicines used during the test).    FOLLOW UP: Our staff will call the number listed on your records the next business day following your procedure.  We will call around 7:15- 8:00 am to check on you and address any questions or concerns that you may have regarding the information given to you following your procedure. If we do not reach you, we will leave a message.     If any biopsies were taken you will be contacted by phone or by letter within the next 1-3 weeks.  Please call us at [phone] if you have not heard about the biopsies in 3 weeks.    SIGNATURES/CONFIDENTIALITY: You and/or your care partner have signed paperwork which will be entered into your electronic medical record.  These signatures attest to the fact that that the information above on your After Visit Summary has been reviewed and is understood.  Full responsibility of the confidentiality of this discharge information lies with you and/or your care-partner.

## 2023-03-16 ENCOUNTER — Telehealth: Payer: Self-pay

## 2023-03-16 NOTE — Telephone Encounter (Signed)
  Follow up Call-     03/15/2023    7:59 AM  Call back number  Post procedure Call Back phone  # [phone]  Permission to leave phone message Yes     Patient questions:  Do you have a fever, pain , or abdominal swelling? No. Pain Score  0 *  Have you tolerated food without any problems? Yes.    Have you been able to return to your normal activities? Yes.    Do you have any questions about your discharge instructions: Diet   No. Medications  No. Follow up visit  No.  Do you have questions or concerns about your Care? No.  Actions: * If pain score is 4 or above: No action needed, pain <4.

## 2023-03-27 ENCOUNTER — Encounter: Payer: Self-pay | Admitting: Internal Medicine

## 2023-03-27 ENCOUNTER — Other Ambulatory Visit: Payer: Self-pay | Admitting: Internal Medicine

## 2024-07-02 NOTE — Progress Notes (Signed)
 REFERRING PHYSICIAN:  Burney Darice CROME, MD PROVIDER:  DEWARD PURCHASE STECHSCHULTE, MD MRN: [id number] DOB: 11/11/1961 DATE OF ENCOUNTER: 07/02/2024 Subjective    Chief Complaint: New Consultation   History of Present Illness: [name] [name] is a 63 y.o. male who is seen today as an office consultation for evaluation of New Consultation  History of Present Illness [name] [name] is a 63 year old male who presents with a persistent abdominal bulge and discomfort due to an umbilical hernia.  He began experiencing soreness around his umbilicus in late June, coinciding with a routine doctor's appointment where he was diagnosed with an umbilical hernia. The hernia is described as a 'knot' that can be reduced but reappears after some time. Initially, the area was very sore for several days, but there has been no nausea or vomiting.  In the following month, the soreness subsided, but the hernia persisted. He experiences discomfort with the hernia, noting that it 'keeps coming back.' He has not had any surgeries on his abdomen other than a myotomy and a Toupet fundoplication for esophageal issues, and a laparoscopic cholecystectomy many years ago.  He mentions a significant weight loss from 240 pounds to under 200 pounds, with a current goal of reaching 170 pounds. He speculates that the weight loss might have contributed to the hernia's prominence. He is cautious with lifting to avoid exacerbating the condition.  No other bulges or knots in the groin area and no additional abdominal surgeries beyond those mentioned.     Review of Systems: A complete review of systems was obtained from the patient.  I have reviewed this information and discussed as appropriate with the patient.  See HPI as well for other ROS.  ROS   Medical History: Past Medical History:  Diagnosis Date  . DVT (deep venous thrombosis) (CMS/HHS-HCC)     There is no problem list on file for this patient.   Past Surgical  History:  Procedure Laterality Date  . CHOLECYSTECTOMY    . LAPAROSCOPIC HELLER MYOTOMY       Allergies  Allergen Reactions  . Venom-Honey Bee Anaphylaxis    Current Outpatient Medications on File Prior to Visit  Medication Sig Dispense Refill  . albuterol MDI, PROVENTIL, VENTOLIN, PROAIR, HFA 90 mcg/actuation inhaler INHALE 1 PUFF BY MOUTH EVERY 6 HOURS AS NEEDED    . aspirin 81 MG EC tablet Take 81 mg by mouth once daily    . atorvastatin  (LIPITOR) 40 MG tablet Take 40 mg by mouth at bedtime    . cyanocobalamin (VITAMIN B12) 1000 MCG tablet Take 1,000 mcg by mouth once daily    . EPINEPHrine  (EPIPEN ) 0.3 mg/0.3 mL auto-injector INJECT 0.3 ML INTO MUSCLE ONE TIME. MAY REPEAT ONE TIME INS COVERS MYLAN    . ergocalciferol, vitamin D2, 1,250 mcg (50,000 unit) capsule     . fluticasone propion-salmeteroL (ADVAIR DISKUS) 250-50 mcg/dose diskus inhaler inhale 1 puff by mouth 2 times per day    . semaglutide (OZEMPIC) 0.25 mg or 0.5 mg(2 mg/1.5 mL) pen injector Inject 0.25 mg subcutaneously every 7 (seven) days     No current facility-administered medications on file prior to visit.    History reviewed. No pertinent family history.   Social History   Tobacco Use  Smoking Status Never  Smokeless Tobacco Never     Social History   Socioeconomic History  . Marital status: Married  Tobacco Use  . Smoking status: Never  . Smokeless tobacco: Never  Vaping Use  .  Vaping status: Never Used  Substance and Sexual Activity  . Alcohol use: Never  . Drug use: Never   Social Drivers of Health   Housing Stability: Unknown (07/02/2024)   Housing Stability Vital Sign   . Homeless in the Last Year: No    Objective:   Vitals:   07/02/24 1138  BP: 114/74  Pulse: 66  Temp: 36.4 C (97.5 F)  SpO2: 99%  Weight: 91.3 kg (201 lb 3.2 oz)  Height: 168.9 cm (5' 6.5)  PainSc: 0-No pain    Body mass index is 31.99 kg/m.  Physical Exam   General appearance - Consistent with stated  age. Normal posture. Voice Normal. Mental status - alert and oriented Integumentary - No rash or lesion on limited exam Head - Normocephalic, atraumatic Face - Strength and tone intact Eyes - extraocular movement intact, sclera anicteric Chest - quiet, even and easy respiratory effort with no use of accessory muscles Neurological - able to articulate well with normal speech/language, rate, volume and coherence. Mood/affect - normal Judgement and insight -  insight is appropriate concerning matters relevant to self and the patient displays appropriate judgment regarding every day activities. Thought Processes/Cognitive Function - aware of current events. Musculoskeletal - strength symmetrical throughout, no deformity Abdomen - Reducible umbilical hernia with approximately 1cm x 1cm fascial defect  Physical Exam ABDOMEN: Umbilical hernia reducible, no other bulges or knots in the groin.  Labs, Imaging and Diagnostic Testing: None  Assessment and Plan:     Diagnoses and all orders for this visit:  Umbilical hernia without obstruction and without gangrene     Assessment & Plan Umbilical hernia without obstruction or gangrene - 1cm x 1cm based on exam  Umbilical hernia identified in late June with soreness and a palpable knot. No nausea or vomiting. Hernia is reducible but recurs intermittently. No prior abdominal surgeries except laparoscopic myotomy, toupee fundoplication, and cholecystectomy. Likely due to a weak spot at the umbilicus, possibly related to previous surgical incisions. Not obstructed or gangrenous, emergency surgery unlikely. Decision to repair based on symptoms interfering with daily activities. - Schedule surgical repair of umbilical hernia. - Use polypropylene mesh if defect is large enough. - Prescribe Percocet for postoperative pain management. - Advise use of Tylenol, ibuprofen, and ice packs for pain. - Restrict heavy lifting and strenuous activity for four  weeks post-surgery. - Allow normal daily activities and walking post-surgery. - Advise against submerging incision underwater until healed. - Coordinate surgery scheduling with his availability.      PAUL JEFFREY STECHSCHULTE, MD    This note has been created using automated tools and reviewed for accuracy by PAUL JEFFREY STECHSCHULTE.

## 2024-10-23 ENCOUNTER — Ambulatory Visit (HOSPITAL_COMMUNITY)
Admission: RE | Admit: 2024-10-23 | Discharge: 2024-10-23 | Disposition: A | Discharge status: Home / Self Care | Source: Ambulatory Visit | Attending: Surgery | Admitting: Surgery

## 2024-10-23 ENCOUNTER — Other Ambulatory Visit (HOSPITAL_COMMUNITY): Payer: Self-pay | Admitting: Family Medicine

## 2024-10-23 DIAGNOSIS — R55 Syncope and collapse: Secondary | ICD-10-CM | POA: Insufficient documentation
